# Patient Record
Sex: Female | Born: 1985
Health system: Southern US, Community
[De-identification: ages and names within clinical notes are randomized; demographics above are authoritative.]

## PROBLEM LIST (undated history)

## (undated) DIAGNOSIS — R000 Tachycardia, unspecified: Secondary | ICD-10-CM

## (undated) DIAGNOSIS — E669 Obesity, unspecified: Secondary | ICD-10-CM

## (undated) DIAGNOSIS — F32A Depression, unspecified: Secondary | ICD-10-CM

## (undated) DIAGNOSIS — J329 Chronic sinusitis, unspecified: Secondary | ICD-10-CM

## (undated) DIAGNOSIS — B373 Candidiasis of vulva and vagina: Secondary | ICD-10-CM

## (undated) DIAGNOSIS — F329 Major depressive disorder, single episode, unspecified: Secondary | ICD-10-CM

## (undated) DIAGNOSIS — T7840XA Allergy, unspecified, initial encounter: Secondary | ICD-10-CM

## (undated) DIAGNOSIS — F419 Anxiety disorder, unspecified: Secondary | ICD-10-CM

## (undated) DIAGNOSIS — R079 Chest pain, unspecified: Secondary | ICD-10-CM

## (undated) DIAGNOSIS — J45909 Unspecified asthma, uncomplicated: Secondary | ICD-10-CM

## (undated) DIAGNOSIS — B3731 Acute candidiasis of vulva and vagina: Secondary | ICD-10-CM

## (undated) DIAGNOSIS — F909 Attention-deficit hyperactivity disorder, unspecified type: Secondary | ICD-10-CM

## (undated) HISTORY — PX: BREAST BIOPSY: SHX20

## (undated) HISTORY — DX: Candidiasis of vulva and vagina: B37.3

## (undated) HISTORY — DX: Unspecified asthma, uncomplicated: J45.909

## (undated) HISTORY — DX: Chronic sinusitis, unspecified: J32.9

## (undated) HISTORY — DX: Allergy, unspecified, initial encounter: T78.40XA

## (undated) HISTORY — DX: Major depressive disorder, single episode, unspecified: F32.9

## (undated) HISTORY — PX: UPPER GASTROINTESTINAL ENDOSCOPY: SHX188

## (undated) HISTORY — DX: Acute candidiasis of vulva and vagina: B37.31

## (undated) HISTORY — DX: Obesity, unspecified: E66.9

## (undated) HISTORY — DX: Chest pain, unspecified: R07.9

## (undated) HISTORY — DX: Depression, unspecified: F32.A

## (undated) HISTORY — DX: Tachycardia, unspecified: R00.0

## (undated) HISTORY — DX: Attention-deficit hyperactivity disorder, unspecified type: F90.9

## (undated) HISTORY — DX: Anxiety disorder, unspecified: F41.9

---
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
## 2006-02-22 ENCOUNTER — Other Ambulatory Visit: Admission: RE | Admit: 2006-02-22 | Discharge: 2006-02-22 | Payer: Self-pay | Admitting: Obstetrics and Gynecology

## 2007-02-27 ENCOUNTER — Other Ambulatory Visit: Admission: RE | Admit: 2007-02-27 | Discharge: 2007-02-27 | Payer: Self-pay | Admitting: Obstetrics and Gynecology

## 2007-12-26 ENCOUNTER — Ambulatory Visit: Payer: Self-pay | Admitting: Obstetrics and Gynecology

## 2008-03-18 ENCOUNTER — Ambulatory Visit: Payer: Self-pay | Admitting: Obstetrics and Gynecology

## 2008-03-18 ENCOUNTER — Encounter: Payer: Self-pay | Admitting: Obstetrics and Gynecology

## 2008-03-18 ENCOUNTER — Other Ambulatory Visit: Admission: RE | Admit: 2008-03-18 | Discharge: 2008-03-18 | Payer: Self-pay | Admitting: Obstetrics and Gynecology

## 2009-03-24 ENCOUNTER — Other Ambulatory Visit: Admission: RE | Admit: 2009-03-24 | Discharge: 2009-03-24 | Payer: Self-pay | Admitting: Obstetrics and Gynecology

## 2009-03-24 ENCOUNTER — Ambulatory Visit: Payer: Self-pay | Admitting: Obstetrics and Gynecology

## 2009-04-29 ENCOUNTER — Encounter: Admission: RE | Admit: 2009-04-29 | Discharge: 2009-04-29 | Payer: Self-pay | Admitting: Family Medicine

## 2009-08-26 ENCOUNTER — Ambulatory Visit: Payer: Self-pay | Admitting: Women's Health

## 2010-04-24 ENCOUNTER — Encounter (INDEPENDENT_AMBULATORY_CARE_PROVIDER_SITE_OTHER): Payer: BC Managed Care – PPO | Admitting: Obstetrics and Gynecology

## 2010-04-24 ENCOUNTER — Other Ambulatory Visit: Payer: Self-pay | Admitting: Obstetrics and Gynecology

## 2010-04-24 ENCOUNTER — Other Ambulatory Visit (HOSPITAL_COMMUNITY)
Admission: RE | Admit: 2010-04-24 | Discharge: 2010-04-24 | Disposition: A | Discharge status: Home / Self Care | Payer: BC Managed Care – PPO | Source: Ambulatory Visit | Attending: Obstetrics and Gynecology | Admitting: Obstetrics and Gynecology

## 2010-04-24 DIAGNOSIS — Z01419 Encounter for gynecological examination (general) (routine) without abnormal findings: Secondary | ICD-10-CM

## 2010-04-24 DIAGNOSIS — Z124 Encounter for screening for malignant neoplasm of cervix: Secondary | ICD-10-CM | POA: Insufficient documentation

## 2010-04-24 DIAGNOSIS — B373 Candidiasis of vulva and vagina: Secondary | ICD-10-CM

## 2010-05-21 HISTORY — PX: OTHER SURGICAL HISTORY: SHX169

## 2010-05-22 ENCOUNTER — Ambulatory Visit (INDEPENDENT_AMBULATORY_CARE_PROVIDER_SITE_OTHER): Payer: BC Managed Care – PPO | Admitting: Obstetrics and Gynecology

## 2010-05-22 DIAGNOSIS — Z30431 Encounter for routine checking of intrauterine contraceptive device: Secondary | ICD-10-CM

## 2010-06-29 ENCOUNTER — Ambulatory Visit (INDEPENDENT_AMBULATORY_CARE_PROVIDER_SITE_OTHER): Payer: BC Managed Care – PPO | Admitting: Obstetrics and Gynecology

## 2010-06-29 DIAGNOSIS — N920 Excessive and frequent menstruation with regular cycle: Secondary | ICD-10-CM

## 2010-06-29 DIAGNOSIS — B373 Candidiasis of vulva and vagina: Secondary | ICD-10-CM

## 2010-06-29 DIAGNOSIS — N946 Dysmenorrhea, unspecified: Secondary | ICD-10-CM

## 2011-06-06 DIAGNOSIS — B373 Candidiasis of vulva and vagina: Secondary | ICD-10-CM | POA: Insufficient documentation

## 2011-06-06 DIAGNOSIS — IMO0001 Reserved for inherently not codable concepts without codable children: Secondary | ICD-10-CM | POA: Insufficient documentation

## 2011-06-06 DIAGNOSIS — B3731 Acute candidiasis of vulva and vagina: Secondary | ICD-10-CM | POA: Insufficient documentation

## 2011-06-15 ENCOUNTER — Encounter: Payer: Self-pay | Admitting: Obstetrics and Gynecology

## 2011-06-15 ENCOUNTER — Other Ambulatory Visit (HOSPITAL_COMMUNITY)
Admission: RE | Admit: 2011-06-15 | Discharge: 2011-06-15 | Disposition: A | Discharge status: Home / Self Care | Payer: BC Managed Care – PPO | Source: Ambulatory Visit | Attending: Obstetrics and Gynecology | Admitting: Obstetrics and Gynecology

## 2011-06-15 ENCOUNTER — Ambulatory Visit (INDEPENDENT_AMBULATORY_CARE_PROVIDER_SITE_OTHER): Payer: BC Managed Care – PPO | Admitting: Obstetrics and Gynecology

## 2011-06-15 VITALS — BP 110/70 | Ht 67.0 in | Wt 150.0 lb

## 2011-06-15 DIAGNOSIS — Z01419 Encounter for gynecological examination (general) (routine) without abnormal findings: Secondary | ICD-10-CM | POA: Insufficient documentation

## 2011-06-15 DIAGNOSIS — B373 Candidiasis of vulva and vagina: Secondary | ICD-10-CM

## 2011-06-15 DIAGNOSIS — B3731 Acute candidiasis of vulva and vagina: Secondary | ICD-10-CM

## 2011-06-15 LAB — HEPATIC FUNCTION PANEL
AST: 20 U/L (ref 0–37)
Alkaline Phosphatase: 64 U/L (ref 39–117)
Bilirubin, Direct: 0.1 mg/dL (ref 0.0–0.3)
Total Bilirubin: 0.6 mg/dL (ref 0.3–1.2)
Total Protein: 6.9 g/dL (ref 6.0–8.3)

## 2011-06-15 LAB — CBC WITH DIFFERENTIAL/PLATELET
Basophils Absolute: 0 10*3/uL (ref 0.0–0.1)
Eosinophils Relative: 3 % (ref 0–5)
HCT: 40.6 % (ref 36.0–46.0)
Lymphocytes Relative: 28 % (ref 12–46)
Lymphs Abs: 2.2 10*3/uL (ref 0.7–4.0)
Monocytes Absolute: 0.3 10*3/uL (ref 0.1–1.0)
Neutrophils Relative %: 65 % (ref 43–77)
RBC: 4.48 MIL/uL (ref 3.87–5.11)
RDW: 12.7 % (ref 11.5–15.5)
WBC: 7.7 10*3/uL (ref 4.0–10.5)

## 2011-06-15 MED ORDER — FLUCONAZOLE 150 MG PO TABS: 150.0000 mg | ORAL_TABLET | Freq: Once | ORAL | Status: DC

## 2011-06-15 MED ORDER — NAPROXEN SODIUM 550 MG PO TABS: 550.0000 mg | ORAL_TABLET | Freq: Two times a day (BID) | ORAL | Status: DC

## 2011-06-15 NOTE — Patient Instructions (Signed)
Get me mammogram, ultrasound, and breast biopsy from [facility].

## 2011-06-15 NOTE — Progress Notes (Signed)
Patient came to see me today for her annual GYN exam. She is very happy with a Mirena IUD with only occasional spotting. She continues to use Anaprox for dysmenorrhea. She continues to use Diflucan for recurrent yeast. She will probably use 7 a month. She wanted me to recheck the lump in her left breast which has been present since she was 18 years. She had undergone mammogram, ultrasound, biopsy in [place] confirming a fibroadenoma. She was to get [place] those records but  we never got them. She does not feel it has gotten bigger but it seems to get smaller than larger through a cycle. It is rarely  Tender. She also has noticed a small growth near her anus which is asymptomatic. She has not been sexually active for 3 years.  Physical examination: Kennon Portela present. HEENT within normal limits. Neck: Thyroid not large. No masses. Supraclavicular nodes: not enlarged. Breasts: Examined in both sitting and lying  position. No skin changes and no masses in right breast. In left breast at 9:00 there is a three quarters of a centimeter firm mobile nodule which is unchanged from 2008. Abdomen: Soft no guarding rebound or masses or hernia. Pelvic: External: Within normal limits. BUS: Within normal limits. Vaginal:within normal limits. Good estrogen effect. No evidence of cystocele rectocele or enterocele. Cervix: clean. IUD string visible Uterus: Normal size and shape. Adnexa: No masses. Rectovaginal exam: Confirmatory and negative. Patient has a small 4 mm nodule near her anus consistent with a small skin papilloma. Extremities: Within normal limits.  Assessment: #1. Stable fibroadenoma of left breast #2. Dysmenorrhea #3. Recurrent yeast infections #4. Papilloma near anus.  Plan: Continue Diflucan and Anaprox. Liver profile done. Patient to get me breast studies from [facility] and I will call her but I believe she needs a followup ultrasound. Reassured about papilloma. For the moment she does not want it  excised.

## 2011-06-16 LAB — URINALYSIS W MICROSCOPIC + REFLEX CULTURE
Casts: NONE SEEN
Glucose, UA: NEGATIVE mg/dL
Ketones, ur: NEGATIVE mg/dL
Leukocytes, UA: NEGATIVE
Nitrite: NEGATIVE
Protein, ur: 30 mg/dL — AB
Specific Gravity, Urine: 1.023 (ref 1.005–1.030)
pH: 7.5 (ref 5.0–8.0)

## 2012-06-20 ENCOUNTER — Ambulatory Visit (INDEPENDENT_AMBULATORY_CARE_PROVIDER_SITE_OTHER): Payer: BC Managed Care – PPO | Admitting: Women's Health

## 2012-06-20 ENCOUNTER — Encounter: Payer: Self-pay | Admitting: Women's Health

## 2012-06-20 VITALS — BP 104/64 | Ht 67.0 in | Wt 163.5 lb

## 2012-06-20 DIAGNOSIS — Z01419 Encounter for gynecological examination (general) (routine) without abnormal findings: Secondary | ICD-10-CM

## 2012-06-20 DIAGNOSIS — B3731 Acute candidiasis of vulva and vagina: Secondary | ICD-10-CM

## 2012-06-20 DIAGNOSIS — B373 Candidiasis of vulva and vagina: Secondary | ICD-10-CM

## 2012-06-20 MED ORDER — FLUCONAZOLE 150 MG PO TABS: ORAL_TABLET | ORAL | Status: DC

## 2012-06-20 NOTE — Patient Instructions (Addendum)

## 2012-06-20 NOTE — Progress Notes (Signed)
Angela Hill [birth year] [id number]    History:    The patient presents for annual exam.  Mirena IUD placed 05/2010/occasional 1- 2 day spotting. Not sexually active for about 5 years. Uses Anaprox as needed for dysmenorrhea. Normal Pap history. gardasil completed. Has had problems with recurrent yeast in the past but has been doing better.   Past medical history, past surgical history, family history and social history were all reviewed and documented in the EPIC chart. 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 Parents healthy, mother Angela Hill, father Angela Hill.   ROS:  A  ROS was performed and pertinent positives and negatives are included in the history.  Exam:  Filed Vitals:   06/20/12 0906  BP: 104/64    General appearance:  Normal Head/Neck:  Normal, without cervical or supraclavicular adenopathy. Thyroid:  Symmetrical, normal in size, without palpable masses or nodularity. Respiratory  Effort:  Normal  Auscultation:  Clear without wheezing or rhonchi Cardiovascular  Auscultation:  Regular rate, without rubs, murmurs or gallops  Edema/varicosities:  Not grossly evident Abdominal  Soft,nontender, without masses, guarding or rebound.  Liver/spleen:  No organomegaly noted  Hernia:  None appreciated  Skin  Inspection:  Grossly normal  Palpation:  Grossly normal Neurologic/psychiatric  Orientation:  Normal with appropriate conversation.  Mood/affect:  Normal  Genitourinary    Breasts: Examined lying and sitting.     Right: Without masses, retractions, discharge or axillary adenopathy.     Left: Without masses, retractions, discharge or axillary adenopathy.   Inguinal/mons:  Normal without inguinal adenopathy  External genitalia:  Normal  BUS/Urethra/Skene's glands:  Normal  Bladder:  Normal  Vagina:  Normal  Cervix:  Normal IUD strings visible  Uterus:  normal in size, shape and contour.  Midline and mobile  Adnexa/parametria:     Rt: Without masses or  tenderness.   Lt: Without masses or tenderness.  Anus and perineum: Normal  Digital rectal exam: Normal sphincter tone without palpated masses or tenderness  Assessment/Plan:  27 y.o. S. WF G0 for annual exam with no complaints.  Mirena IUD 05/2010 rare spotting  Plan: CBC, UA, Pap normal 2013, new screening guidelines reviewed. Diflucan 150 prescription given for occasional yeast, currently none. SBE's, continue regular exercise, calcium rich diet, MVI daily encouraged. Condoms encouraged if becomes sexually active.    [facility], 10:54 AM 06/20/2012

## 2012-06-21 LAB — URINALYSIS W MICROSCOPIC + REFLEX CULTURE
Bilirubin Urine: NEGATIVE
Glucose, UA: NEGATIVE mg/dL
Ketones, ur: NEGATIVE mg/dL
Protein, ur: NEGATIVE mg/dL

## 2013-06-23 ENCOUNTER — Telehealth: Payer: Self-pay | Admitting: *Deleted

## 2013-06-23 ENCOUNTER — Encounter: Payer: Self-pay | Admitting: Gynecology

## 2013-06-23 ENCOUNTER — Ambulatory Visit (INDEPENDENT_AMBULATORY_CARE_PROVIDER_SITE_OTHER): Payer: BC Managed Care – PPO | Admitting: Gynecology

## 2013-06-23 VITALS — BP 122/78 | Ht 67.0 in | Wt 163.0 lb

## 2013-06-23 DIAGNOSIS — N898 Other specified noninflammatory disorders of vagina: Secondary | ICD-10-CM

## 2013-06-23 DIAGNOSIS — A499 Bacterial infection, unspecified: Secondary | ICD-10-CM

## 2013-06-23 DIAGNOSIS — Z01419 Encounter for gynecological examination (general) (routine) without abnormal findings: Secondary | ICD-10-CM

## 2013-06-23 DIAGNOSIS — Z30432 Encounter for removal of intrauterine contraceptive device: Secondary | ICD-10-CM

## 2013-06-23 DIAGNOSIS — Z309 Encounter for contraceptive management, unspecified: Secondary | ICD-10-CM

## 2013-06-23 DIAGNOSIS — N760 Acute vaginitis: Secondary | ICD-10-CM

## 2013-06-23 DIAGNOSIS — B9689 Other specified bacterial agents as the cause of diseases classified elsewhere: Secondary | ICD-10-CM

## 2013-06-23 DIAGNOSIS — B3731 Acute candidiasis of vulva and vagina: Secondary | ICD-10-CM

## 2013-06-23 DIAGNOSIS — B373 Candidiasis of vulva and vagina: Secondary | ICD-10-CM

## 2013-06-23 LAB — CBC WITH DIFFERENTIAL/PLATELET
BASOS PCT: 0 % (ref 0–1)
Basophils Absolute: 0 10*3/uL (ref 0.0–0.1)
Eosinophils Absolute: 0.2 10*3/uL (ref 0.0–0.7)
Eosinophils Relative: 3 % (ref 0–5)
HCT: 38 % (ref 36.0–46.0)
Hemoglobin: 13.2 g/dL (ref 12.0–15.0)
LYMPHS ABS: 2 10*3/uL (ref 0.7–4.0)
LYMPHS PCT: 26 % (ref 12–46)
MCH: 29.5 pg (ref 26.0–34.0)
MCHC: 34.7 g/dL (ref 30.0–36.0)
MCV: 84.8 fL (ref 78.0–100.0)
MONO ABS: 0.5 10*3/uL (ref 0.1–1.0)
Monocytes Relative: 6 % (ref 3–12)
NEUTROS ABS: 5.1 10*3/uL (ref 1.7–7.7)
NEUTROS PCT: 65 % (ref 43–77)
Platelets: 234 10*3/uL (ref 150–400)
RBC: 4.48 MIL/uL (ref 3.87–5.11)
RDW: 12.9 % (ref 11.5–15.5)
WBC: 7.8 10*3/uL (ref 4.0–10.5)

## 2013-06-23 LAB — TSH: TSH: 1.706 u[IU]/mL (ref 0.350–4.500)

## 2013-06-23 LAB — COMPREHENSIVE METABOLIC PANEL
ALK PHOS: 58 U/L (ref 39–117)
ALT: 12 U/L (ref 0–35)
AST: 20 U/L (ref 0–37)
Albumin: 4.3 g/dL (ref 3.5–5.2)
BUN: 13 mg/dL (ref 6–23)
CALCIUM: 9.2 mg/dL (ref 8.4–10.5)
CO2: 26 mEq/L (ref 19–32)
CREATININE: 0.71 mg/dL (ref 0.50–1.10)
Chloride: 101 mEq/L (ref 96–112)
Glucose, Bld: 84 mg/dL (ref 70–99)
Potassium: 4.7 mEq/L (ref 3.5–5.3)
Sodium: 138 mEq/L (ref 135–145)
TOTAL PROTEIN: 6.9 g/dL (ref 6.0–8.3)
Total Bilirubin: 0.6 mg/dL (ref 0.2–1.2)

## 2013-06-23 LAB — WET PREP FOR TRICH, YEAST, CLUE: Trich, Wet Prep: NONE SEEN

## 2013-06-23 LAB — CHOLESTEROL, TOTAL: CHOLESTEROL: 152 mg/dL (ref 0–200)

## 2013-06-23 MED ORDER — LEVONORGESTREL-ETHINYL ESTRAD 0.15-30 MG-MCG PO TABS: 1.0000 | ORAL_TABLET | Freq: Every day | ORAL | Status: DC

## 2013-06-23 MED ORDER — FLUCONAZOLE 150 MG PO TABS
150.0000 mg | ORAL_TABLET | Freq: Once | ORAL | Status: DC
Start: 2013-06-23 — End: 2013-06-23

## 2013-06-23 MED ORDER — DROSPIRENONE-ETHINYL ESTRADIOL 3-0.02 MG PO TABS: 1.0000 | ORAL_TABLET | Freq: Every day | ORAL | Status: DC

## 2013-06-23 MED ORDER — METRONIDAZOLE 500 MG PO TABS: 500.0000 mg | ORAL_TABLET | Freq: Two times a day (BID) | ORAL | Status: DC

## 2013-06-23 MED ORDER — FLUCONAZOLE 150 MG PO TABS: 150.0000 mg | ORAL_TABLET | Freq: Once | ORAL | Status: DC

## 2013-06-23 NOTE — Telephone Encounter (Signed)
Pt was seen today and 2 rx were sent to wrong pharmacy birth control pills and diflucan sent to correct pharmacy.

## 2013-06-23 NOTE — Progress Notes (Addendum)
Angela Hill 1985/11/15 [id number]   History:    28 y.o.  for annual gyn exam requesting to have her Mirena IUD removed. She is interested in using some other form of contraception that would help also for her acne. She was also having slight vaginal discharge. Patient has completed the HPV vaccine in the past. Patient is in the process of going overseas for [place] work in some [place]. Patient denies any prior history of abnormal Pap smear.  Past medical history,surgical history, family history and social history were all reviewed and documented in the EPIC chart.  Gynecologic History No LMP recorded. Patient is not currently having periods (Reason: IUD). Contraception: IUD Last Pap: 2013. Results were: normal Last mammogram: Not indicated. Results were: Not indicated  Obstetric History OB History  Gravida Para Term Preterm AB SAB TAB Ectopic Multiple Living  0                  ROS: A ROS was performed and pertinent positives and negatives are included in the history.  GENERAL: No fevers or chills. HEENT: No change in vision, no earache, sore throat or sinus congestion. NECK: No pain or stiffness. CARDIOVASCULAR: No chest pain or pressure. No palpitations. PULMONARY: No shortness of breath, cough or wheeze. GASTROINTESTINAL: No abdominal pain, nausea, vomiting or diarrhea, melena or bright red blood per rectum. GENITOURINARY: No urinary frequency, urgency, hesitancy or dysuria. MUSCULOSKELETAL: No joint or muscle pain, no back pain, no recent trauma. DERMATOLOGIC: No rash, no itching, no lesions. ENDOCRINE: No polyuria, polydipsia, no heat or cold intolerance. No recent change in weight. HEMATOLOGICAL: No anemia or easy bruising or bleeding. NEUROLOGIC: No headache, seizures, numbness, tingling or weakness. PSYCHIATRIC: No depression, no loss of interest in normal activity or change in sleep pattern.     Exam: chaperone present  BP 122/78  Ht 5\' 7"  (1.702 m)  Wt 163 lb  (73.936 kg)  BMI 25.52 kg/m2  Body mass index is 25.52 kg/(m^2).  General appearance : Well developed well nourished female. No acute distress HEENT: Neck supple, trachea midline, no carotid bruits, no thyroidmegaly Lungs: Clear to auscultation, no rhonchi or wheezes, or rib retractions  Heart: Regular rate and rhythm, no murmurs or gallops Breast:Examined in sitting and supine position were symmetrical in appearance, no palpable masses or tenderness,  no skin retraction, no nipple inversion, no nipple discharge, no skin discoloration, no axillary or supraclavicular lymphadenopathy Abdomen: no palpable masses or tenderness, no rebound or guarding Extremities: no edema or skin discoloration or tenderness  Pelvic:  Bartholin, Urethra, Skene Glands: Within normal limits             Vagina: No gross lesions or discharge  Cervix: No gross lesions or discharge, IUD string seen  Uterus  anteverted, normal size, shape and consistency, non-tender and mobile  Adnexa  Without masses or tenderness  Anus and perineum  normal   Rectovaginal  normal sphincter tone without palpated masses or tenderness             Hemoccult not indicated   The IUD string was grasped with a [place] clamp and retrieved and shown to the patient and discarded.  Wet prep demonstrated evidence of yeast and few clue cells 3 WBCs and too numerous to count bacteria  Assessment/Plan:  28 y.o. female for annual exam visual B. prescribe Diflucan 150 mg one by mouth today. She will also be prescribed Flagyl 500 mg one by mouth twice a day for 7 days.  Patient will be started on Nordette oral contraceptive pill. Patient had been on oral contraceptive pills in the past. Patient denies any prior history of bleeding disorders in her or her family. She was monitored on course of monthly self breast examination her vaccines before trip overseas are up to date. Pap smear not done today in accordance to The guidelines. The following labs were  ordered today: CBC, comprehensive metabolic panel, TSH, urinalysis, screen cholesterol.   Note: This dictation was prepared with  Dragon/digital dictation along withSmart phrase technology. Any transcriptional errors that result from this process are unintentional.   Terrance Mass MD, 4:08 PM 06/23/2013

## 2013-06-23 NOTE — Patient Instructions (Addendum)
Oral Contraception Information Oral contraceptive pills (OCPs) are medicines taken to prevent pregnancy. OCPs work by preventing the ovaries from releasing eggs. The hormones in OCPs also cause the cervical mucus to thicken, preventing the sperm from entering the uterus. The hormones also cause the uterine lining to become thin, not allowing a fertilized egg to attach to the inside of the uterus. OCPs are highly effective when taken exactly as prescribed. However, OCPs do not prevent sexually transmitted diseases (STDs). Safe sex practices, such as using condoms along with the pill, can help prevent STDs.  Before taking the pill, you may have a physical exam and Pap test. Your health care provider may order blood tests. The health care provider will make sure you are a good candidate for oral contraception. Discuss with your health care provider the possible side effects of the OCP you may be prescribed. When starting an OCP, it can take 2 to 3 months for the body to adjust to the changes in hormone levels in your body.  TYPES OF ORAL CONTRACEPTION  The combination pill This pill contains estrogen and progestin (synthetic progesterone) hormones. The combination pill comes in 21-day, 28-day, or 91-day packs. Some types of combination pills are meant to be taken continuously (365-day pills). With 21-day packs, you do not take pills for 7 days after the last pill. With 28-day packs, the pill is taken every day. The last 7 pills are without hormones. Certain types of pills have more than 21 hormone-containing pills. With 91-day packs, the first 84 pills contain both hormones, and the last 7 pills contain no hormones or contain estrogen only.  The minipill This pill contains the progesterone hormone only. The pill is taken every day continuously. It is very important to take the pill at the same time each day. The minipill comes in packs of 28 pills. All 28 pills contain the hormone.  ADVANTAGES OF ORAL  CONTRACEPTIVE PILLS  Decreases premenstrual symptoms.   Treats menstrual period cramps.   Regulates the menstrual cycle.   Decreases a heavy menstrual flow.   May treatacne, depending on the type of pill.   Treats abnormal uterine bleeding.   Treats polycystic ovarian syndrome.   Treats endometriosis.   Can be used as emergency contraception.  THINGS THAT CAN MAKE ORAL CONTRACEPTIVE PILLS LESS EFFECTIVE OCPs can be less effective if:   You forget to take the pill at the same time every day.   You have a stomach or intestinal disease that lessens the absorption of the pill.   You take OCPs with other medicines that make OCPs less effective, such as antibiotics, certain HIV medicines, and some seizure medicines.   You take expired OCPs.   You forget to restart the pill on day 7, when using the packs of 21 pills.  RISKS ASSOCIATED WITH ORAL CONTRACEPTIVE PILLS  Oral contraceptive pills can sometimes cause side effects, such as:  Headache.  Nausea.  Breast tenderness.  Irregular bleeding or spotting. Combination pills are also associated with a small increased risk of:  Blood clots.  Heart attack.  Stroke. Document Released: 04/28/2002 Document Revised: 11/26/2012 Document Reviewed: 07/27/2012 [facility] Patient Information 2014 [place], [place]. Monilial Vaginitis Vaginitis in a soreness, swelling and redness (inflammation) of the vagina and vulva. Monilial vaginitis is not a sexually transmitted infection. CAUSES  Yeast vaginitis is caused by yeast (candida) that is normally found in your vagina. With a yeast infection, the candida has overgrown in number to a point that upsets the  chemical balance. SYMPTOMS   White, thick vaginal discharge.  Swelling, itching, redness and irritation of the vagina and possibly the lips of the vagina (vulva).  Burning or painful urination.  Painful intercourse. DIAGNOSIS  Things that may contribute to monilial  vaginitis are:  Postmenopausal and virginal states.  Pregnancy.  Infections.  Being tired, sick or stressed, especially if you had monilial vaginitis in the past.  Diabetes. Good control will help lower the chance.  Birth control pills.  Tight fitting garments.  Using bubble bath, feminine sprays, douches or deodorant tampons.  Taking certain medications that kill germs (antibiotics).  Sporadic recurrence can occur if you become ill. TREATMENT  Your caregiver will give you medication.  There are several kinds of anti monilial vaginal creams and suppositories specific for monilial vaginitis. For recurrent yeast infections, use a suppository or cream in the vagina 2 times a week, or as directed.  Anti-monilial or steroid cream for the itching or irritation of the vulva may also be used. Get your caregiver's permission.  Painting the vagina with methylene blue solution may help if the monilial cream does not work.  Eating yogurt may help prevent monilial vaginitis. HOME CARE INSTRUCTIONS   Finish all medication as prescribed.  Do not have sex until treatment is completed or after your caregiver tells you it is okay.  Take warm sitz baths.  Do not douche.  Do not use tampons, especially scented ones.  Wear cotton underwear.  Avoid tight pants and panty hose.  Tell your sexual partner that you have a yeast infection. They should go to their caregiver if they have symptoms such as mild rash or itching.  Your sexual partner should be treated as well if your infection is difficult to eliminate.  Practice safer sex. Use condoms.  Some vaginal medications cause latex condoms to fail. Vaginal medications that harm condoms are:  Cleocin cream.  Butoconazole (Femstat).  Terconazole (Terazol) vaginal suppository.  Miconazole (Monistat) (may be purchased over the counter). SEEK MEDICAL CARE IF:   You have a temperature by mouth above 102 F (38.9 C).  The  infection is getting worse after 2 days of treatment.  The infection is not getting better after 3 days of treatment.  You develop blisters in or around your vagina.  You develop vaginal bleeding, and it is not your menstrual period.  You have pain when you urinate.  You develop intestinal problems.  You have pain with sexual intercourse. Document Released: 11/15/2004 Document Revised: 04/30/2011 Document Reviewed: 07/30/2008 [facility] Patient Information 2014 [place], [place].

## 2013-06-24 ENCOUNTER — Telehealth: Payer: Self-pay | Admitting: *Deleted

## 2013-06-24 LAB — URINALYSIS W MICROSCOPIC + REFLEX CULTURE
BILIRUBIN URINE: NEGATIVE
Bacteria, UA: NONE SEEN
CASTS: NONE SEEN
CRYSTALS: NONE SEEN
Glucose, UA: NEGATIVE mg/dL
Hgb urine dipstick: NEGATIVE
KETONES UR: NEGATIVE mg/dL
Leukocytes, UA: NEGATIVE
Nitrite: NEGATIVE
PROTEIN: 30 mg/dL — AB
SPECIFIC GRAVITY, URINE: 1.028 (ref 1.005–1.030)
SQUAMOUS EPITHELIAL / LPF: NONE SEEN
Urobilinogen, UA: 0.2 mg/dL (ref 0.0–1.0)
pH: 6 (ref 5.0–8.0)

## 2013-06-24 MED ORDER — METRONIDAZOLE 500 MG PO TABS: 500.0000 mg | ORAL_TABLET | Freq: Two times a day (BID) | ORAL | Status: DC

## 2013-06-24 NOTE — Telephone Encounter (Signed)
Pt informed with the below note, rx sent. 

## 2013-06-24 NOTE — Telephone Encounter (Signed)
Message copied by Thamas Jaegers on Wed Jun 24, 2013  9:04 AM ------      Message from: Terrance Mass      Created: Tue Jun 23, 2013  4:13 PM       Anderson Malta, please contact patient and tell her that after she left I got the full report on her wet prep that in addition to the yeast infection she has a mild bacterial infection call bacterial vaginosis. To her that I have called for and prescription call Flagyl that like her to take 1 twice a day for 7 days. Tell her this is a very common infection in women due to menstrual blood and swept as a result of vaginal pH changes ------

## 2013-06-26 ENCOUNTER — Telehealth: Payer: Self-pay | Admitting: *Deleted

## 2013-06-26 MED ORDER — IBUPROFEN 800 MG PO TABS: 800.0000 mg | ORAL_TABLET | Freq: Three times a day (TID) | ORAL | Status: DC | PRN

## 2013-06-26 NOTE — Telephone Encounter (Signed)
Pt informed with the below note, rx sent. 

## 2013-06-26 NOTE — Telephone Encounter (Signed)
She can take one tablet of the birth control pill twice a day for the next 3 days then continue daily as normally prescribed to help stop her cycle currently slowed down. You can also calling Motrin 800 mg to take 1 by mouth 3 times a day for the next 3-5 days. #30 with 2 refills

## 2013-06-26 NOTE — Telephone Encounter (Signed)
Pt had IUD removed on 06/23/13 had some heavy bleeding this am with 4 hours changed her tampon 5-6 times due to bleeding. Pt was given Rx for birth control pills at [facility] she asked if okay to start pills? Will that help with the heavy bleeding? Please advise

## 2013-11-04 ENCOUNTER — Other Ambulatory Visit: Payer: Self-pay

## 2013-11-04 MED ORDER — LEVONORGESTREL-ETHINYL ESTRAD 0.15-30 MG-MCG PO TABS: 1.0000 | ORAL_TABLET | Freq: Every day | ORAL | Status: DC

## 2014-02-17 ENCOUNTER — Ambulatory Visit
Admission: RE | Admit: 2014-02-17 | Discharge: 2014-02-17 | Disposition: A | Discharge status: Home / Self Care | Payer: BC Managed Care – PPO | Source: Ambulatory Visit | Attending: Family Medicine | Admitting: Family Medicine

## 2014-02-17 ENCOUNTER — Other Ambulatory Visit: Payer: Self-pay | Admitting: Family Medicine

## 2014-02-17 DIAGNOSIS — J0181 Other acute recurrent sinusitis: Secondary | ICD-10-CM

## 2014-06-21 ENCOUNTER — Encounter: Payer: Self-pay | Admitting: Gynecology

## 2014-06-21 ENCOUNTER — Ambulatory Visit (INDEPENDENT_AMBULATORY_CARE_PROVIDER_SITE_OTHER): Payer: BLUE CROSS/BLUE SHIELD | Admitting: Gynecology

## 2014-06-21 VITALS — BP 110/70 | Ht 67.0 in | Wt 167.0 lb

## 2014-06-21 DIAGNOSIS — Z124 Encounter for screening for malignant neoplasm of cervix: Secondary | ICD-10-CM | POA: Diagnosis not present

## 2014-06-21 DIAGNOSIS — A499 Bacterial infection, unspecified: Secondary | ICD-10-CM | POA: Diagnosis not present

## 2014-06-21 DIAGNOSIS — B9689 Other specified bacterial agents as the cause of diseases classified elsewhere: Secondary | ICD-10-CM

## 2014-06-21 DIAGNOSIS — Z01419 Encounter for gynecological examination (general) (routine) without abnormal findings: Secondary | ICD-10-CM | POA: Diagnosis not present

## 2014-06-21 DIAGNOSIS — N898 Other specified noninflammatory disorders of vagina: Secondary | ICD-10-CM | POA: Diagnosis not present

## 2014-06-21 DIAGNOSIS — N760 Acute vaginitis: Secondary | ICD-10-CM

## 2014-06-21 LAB — COMPREHENSIVE METABOLIC PANEL
ALK PHOS: 51 U/L (ref 39–117)
ALT: 18 U/L (ref 0–35)
AST: 23 U/L (ref 0–37)
Albumin: 4 g/dL (ref 3.5–5.2)
BILIRUBIN TOTAL: 0.4 mg/dL (ref 0.2–1.2)
BUN: 12 mg/dL (ref 6–23)
CO2: 27 mEq/L (ref 19–32)
CREATININE: 0.68 mg/dL (ref 0.50–1.10)
Calcium: 9.1 mg/dL (ref 8.4–10.5)
Chloride: 103 mEq/L (ref 96–112)
Glucose, Bld: 78 mg/dL (ref 70–99)
Potassium: 4.1 mEq/L (ref 3.5–5.3)
Sodium: 140 mEq/L (ref 135–145)
TOTAL PROTEIN: 7.2 g/dL (ref 6.0–8.3)

## 2014-06-21 LAB — CBC WITH DIFFERENTIAL/PLATELET
Basophils Absolute: 0 10*3/uL (ref 0.0–0.1)
Basophils Relative: 0 % (ref 0–1)
Eosinophils Absolute: 0.6 10*3/uL (ref 0.0–0.7)
Eosinophils Relative: 7 % — ABNORMAL HIGH (ref 0–5)
HEMATOCRIT: 41.4 % (ref 36.0–46.0)
HEMOGLOBIN: 13.5 g/dL (ref 12.0–15.0)
LYMPHS ABS: 1.9 10*3/uL (ref 0.7–4.0)
Lymphocytes Relative: 22 % (ref 12–46)
MCH: 28.1 pg (ref 26.0–34.0)
MCHC: 32.6 g/dL (ref 30.0–36.0)
MCV: 86.1 fL (ref 78.0–100.0)
MPV: 11.1 fL (ref 8.6–12.4)
Monocytes Absolute: 0.4 10*3/uL (ref 0.1–1.0)
Monocytes Relative: 5 % (ref 3–12)
NEUTROS ABS: 5.8 10*3/uL (ref 1.7–7.7)
NEUTROS PCT: 66 % (ref 43–77)
PLATELETS: 249 10*3/uL (ref 150–400)
RBC: 4.81 MIL/uL (ref 3.87–5.11)
RDW: 13.2 % (ref 11.5–15.5)
WBC: 8.8 10*3/uL (ref 4.0–10.5)

## 2014-06-21 LAB — WET PREP FOR TRICH, YEAST, CLUE
Trich, Wet Prep: NONE SEEN
YEAST WET PREP: NONE SEEN

## 2014-06-21 LAB — TSH: TSH: 0.854 u[IU]/mL (ref 0.350–4.500)

## 2014-06-21 LAB — CHOLESTEROL, TOTAL: Cholesterol: 174 mg/dL (ref 0–200)

## 2014-06-21 MED ORDER — LEVONORGESTREL-ETHINYL ESTRAD 0.15-30 MG-MCG PO TABS
1.0000 | ORAL_TABLET | Freq: Every day | ORAL | Status: DC
Start: 2014-06-21 — End: 2015-08-24

## 2014-06-21 MED ORDER — TINIDAZOLE 500 MG PO TABS: 500.0000 mg | ORAL_TABLET | Freq: Once | ORAL | Status: DC

## 2014-06-21 MED ORDER — FLUCONAZOLE 150 MG PO TABS: ORAL_TABLET | ORAL | Status: DC

## 2014-06-21 NOTE — Patient Instructions (Signed)
Monilial Vaginitis Vaginitis in a soreness, swelling and redness (inflammation) of the vagina and vulva. Monilial vaginitis is not a sexually transmitted infection. CAUSES  Yeast vaginitis is caused by yeast (candida) that is normally found in your vagina. With a yeast infection, the candida has overgrown in number to a point that upsets the chemical balance. SYMPTOMS   White, thick vaginal discharge.  Swelling, itching, redness and irritation of the vagina and possibly the lips of the vagina (vulva).  Burning or painful urination.  Painful intercourse. DIAGNOSIS  Things that may contribute to monilial vaginitis are:  Postmenopausal and virginal states.  Pregnancy.  Infections.  Being tired, sick or stressed, especially if you had monilial vaginitis in the past.  Diabetes. Good control will help lower the chance.  Birth control pills.  Tight fitting garments.  Using bubble bath, feminine sprays, douches or deodorant tampons.  Taking certain medications that kill germs (antibiotics).  Sporadic recurrence can occur if you become ill. TREATMENT  Your caregiver will give you medication.  There are several kinds of anti monilial vaginal creams and suppositories specific for monilial vaginitis. For recurrent yeast infections, use a suppository or cream in the vagina 2 times a week, or as directed.  Anti-monilial or steroid cream for the itching or irritation of the vulva may also be used. Get your caregiver's permission.  Painting the vagina with methylene blue solution may help if the monilial cream does not work.  Eating yogurt may help prevent monilial vaginitis. HOME CARE INSTRUCTIONS   Finish all medication as prescribed.  Do not have sex until treatment is completed or after your caregiver tells you it is okay.  Take warm sitz baths.  Do not douche.  Do not use tampons, especially scented ones.  Wear cotton underwear.  Avoid tight pants and panty  hose.  Tell your sexual partner that you have a yeast infection. They should go to their caregiver if they have symptoms such as mild rash or itching.  Your sexual partner should be treated as well if your infection is difficult to eliminate.  Practice safer sex. Use condoms.  Some vaginal medications cause latex condoms to fail. Vaginal medications that harm condoms are:  Cleocin cream.  Butoconazole (Femstat).  Terconazole (Terazol) vaginal suppository.  Miconazole (Monistat) (may be purchased over the counter). SEEK MEDICAL CARE IF:   You have a temperature by mouth above 102 F (38.9 C).  The infection is getting worse after 2 days of treatment.  The infection is not getting better after 3 days of treatment.  You develop blisters in or around your vagina.  You develop vaginal bleeding, and it is not your menstrual period.  You have pain when you urinate.  You develop intestinal problems.  You have pain with sexual intercourse. Document Released: 11/15/2004 Document Revised: 04/30/2011 Document Reviewed: 07/30/2008 [facility] Patient Information 2015 [place], [place]. This information is not intended to replace advice given to you by your health care provider. Make sure you discuss any questions you have with your health care provider. Bacterial Vaginosis Bacterial vaginosis is a vaginal infection that occurs when the normal balance of bacteria in the vagina is disrupted. It results from an overgrowth of certain bacteria. This is the most common vaginal infection in women of childbearing age. Treatment is important to prevent complications, especially in pregnant women, as it can cause a premature delivery. CAUSES  Bacterial vaginosis is caused by an increase in harmful bacteria that are normally present in smaller amounts in the  vagina. Several different kinds of bacteria can cause bacterial vaginosis. However, the reason that the condition develops is not fully  understood. RISK FACTORS Certain activities or behaviors can put you at an increased risk of developing bacterial vaginosis, including:  Having a new sex partner or multiple sex partners.  Douching.  Using an intrauterine device (IUD) for contraception. Women do not get bacterial vaginosis from toilet seats, bedding, swimming pools, or contact with objects around them. SIGNS AND SYMPTOMS  Some women with bacterial vaginosis have no signs or symptoms. Common symptoms include:  Grey vaginal discharge.  A fishlike odor with discharge, especially after sexual intercourse.  Itching or burning of the vagina and vulva.  Burning or pain with urination. DIAGNOSIS  Your health care provider will take a medical history and examine the vagina for signs of bacterial vaginosis. A sample of vaginal fluid may be taken. Your health care provider will look at this sample under a microscope to check for bacteria and abnormal cells. A vaginal pH test may also be done.  TREATMENT  Bacterial vaginosis may be treated with antibiotic medicines. These may be given in the form of a pill or a vaginal cream. A second round of antibiotics may be prescribed if the condition comes back after treatment.  HOME CARE INSTRUCTIONS   Only take over-the-counter or prescription medicines as directed by your health care provider.  If antibiotic medicine was prescribed, take it as directed. Make sure you finish it even if you start to feel better.  Do not have sex until treatment is completed.  Tell all sexual partners that you have a vaginal infection. They should see their health care provider and be treated if they have problems, such as a mild rash or itching.  Practice safe sex by using condoms and only having one sex partner. SEEK MEDICAL CARE IF:   Your symptoms are not improving after 3 days of treatment.  You have increased discharge or pain.  You have a fever. MAKE SURE YOU:   Understand these  instructions.  Will watch your condition.  Will get help right away if you are not doing well or get worse. FOR MORE INFORMATION  Centers for Disease Control and Prevention, Division of STD Prevention: [employer]raiser[employer]raud[employer] American Sexual Health Association (ASHA): www.ashastd.org  Document Released: 02/05/2005 Document Revised: 11/26/2012 Document Reviewed: 09/17/2012 [facility] Patient Information 2015 [place], [place]. This information is not intended to replace advice given to you by your health care provider. Make sure you discuss any questions you have with your health care provider. Fluconazole injection What is this medicine? FLUCONAZOLE (floo KON na zole) is an antifungal medicine. It is used to treat or prevent certain kinds of fungal or yeast infections. This medicine may be used for other purposes; ask your health care provider or pharmacist if you have questions. COMMON BRAND NAME(S): Diflucan What should I tell my health care provider before I take this medicine? They need to know if you have any of these conditions: -history of irregular heart beat -kidney disease -an unusual or allergic reaction to fluconazole, other antifungal medicines, foods, dyes or preservatives -pregnant or trying to get pregnant -breast-feeding How should I use this medicine? This medicine is for injection into a vein. It is usually given by a health care professional in a hospital or clinic setting. If you get this medicine at home, you will be taught how to prepare and give this medicine. Use exactly as directed. Take your medicine at regular intervals. Do not  take your medicine more often than directed. It is important that you put your used needles and syringes in a special sharps container. Do not put them in a trash can. If you do not have a sharps container, call your pharmacist or healthcare provider to get one. Talk to your pediatrician regarding the use of this medicine in children. Special care may  be needed. Overdosage: If you think you have taken too much of this medicine contact a poison control center or emergency room at once. NOTE: This medicine is only for you. Do not share this medicine with others. What if I miss a dose? This does not apply. What may interact with this medicine? Do not take this medicine with any of the following medications: -cisapride -pimozide -red yeast rice This medicine may also interact with the following medications: -birth control pills -cyclosporine -diuretics like hydrochlorothiazide -medicines for diabetes that are taken by mouth -medicines for high cholesterol like atorvastatin, lovastatin or simvastatin -phenytoin -ramelteon -rifabutin -rifampin -some medicines for anxiety or sleep -tacrolimus -terfenadine -theophylline -tofacitinib -warfarin This list may not describe all possible interactions. Give your health care provider a list of all the medicines, herbs, non-prescription drugs, or dietary supplements you use. Also tell them if you smoke, drink alcohol, or use illegal drugs. Some items may interact with your medicine. What should I watch for while using this medicine? Tell your doctor if your symptoms do not improve. If you are taking this medicine for a long time you may need blood work. Some fungal infections need many weeks or months of treatment to cure completely. Alcohol can increase possible damage to your liver from this medicine. Avoid alcoholic drinks. What side effects may I notice from receiving this medicine? Side effects that you should report to your doctor or health care professional as soon as possible: -allergic reactions like skin rash or itching, hives, swelling of the lips, mouth, tongue, or throat -dark urine -feeling dizzy or faint -irregular heartbeat or chest pain -pain, redness at site of injection -redness, blistering, peeling or loosening of the skin, including inside the mouth -stomach  pain -trouble breathing -unusual bruising or bleeding -vomiting -yellowing of the eyes or skin Side effects that usually do not require medical attention (report to your doctor or health care professional if they continue or are bothersome): -changes in how food tastes -diarrhea -headache -stomach upset, nausea This list may not describe all possible side effects. Call your doctor for medical advice about side effects. You may report side effects to FDA at 1-800-FDA-1088. Where should I keep my medicine? Keep out of the reach of children. If you are using this medicine at home, you will be instructed on how to store this medicine. Throw away any unused medicine after the expiration date on the label. NOTE: This sheet is a summary. It may not cover all possible information. If you have questions about this medicine, talk to your doctor, pharmacist, or health care provider.  2015, Elsevier/Gold Standard. (2012-09-13 15:51:41) Tinidazole tablets What is this medicine? TINIDAZOLE (tye NI da zole) is an antiinfective. It is used to treat amebiasis, giardiasis, trichomoniasis, and vaginosis. It will not work for colds, flu, or other viral infections. This medicine may be used for other purposes; ask your health care provider or pharmacist if you have questions. COMMON BRAND NAME(S): Tindamax What should I tell my health care provider before I take this medicine? They need to know if you have any of these conditions: -anemia or other blood  disorders -if you frequently drink alcohol containing drinks -receiving hemodialysis -seizure disorder -an unusual or allergic reaction to tinidazole, other medicines, foods, dyes, or preservatives -pregnant or trying to get pregnant -breast-feeding How should I use this medicine? Take this medicine by mouth with a full glass of water. Follow the directions on the prescription label. Take with food. Take your medicine at regular intervals. Do not take your  medicine more often than directed. Take all of your medicine as directed even if you think you are better. Do not skip doses or stop your medicine early. Talk to your pediatrician regarding the use of this medicine in children. While this drug may be prescribed for children as young as 42 years of age for selected conditions, precautions do apply. Overdosage: If you think you have taken too much of this medicine contact a poison control center or emergency room at once. NOTE: This medicine is only for you. Do not share this medicine with others. What if I miss a dose? If you miss a dose, take it as soon as you can. If it is almost time for your next dose, take only that dose. Do not take double or extra doses. What may interact with this medicine? Do not take this medicine with any of the following medications: -alcohol or any product that contains alcohol -amprenavir oral solution -disulfiram -paclitaxel injection -ritonavir oral solution -sertraline oral solution -sulfamethoxazole-trimethoprim injection This medicine may also interact with the following medications: -cholestyramine -cimetidine -conivaptan -cyclosporin -fluorouracil -fosphenytoin, phenytoin -ketoconazole -lithium -phenobarbital -tacrolimus -warfarin This list may not describe all possible interactions. Give your health care provider a list of all the medicines, herbs, non-prescription drugs, or dietary supplements you use. Also tell them if you smoke, drink alcohol, or use illegal drugs. Some items may interact with your medicine. What should I watch for while using this medicine? Tell your doctor or health care professional if your symptoms do not improve or if they get worse. Avoid alcoholic drinks while you are taking this medicine and for three days afterward. Alcohol may make you feel dizzy, sick, or flushed. If you are being treated for a sexually transmitted disease, avoid sexual contact until you have finished  your treatment. Your sexual partner may also need treatment. What side effects may I notice from receiving this medicine? Side effects that you should report to your doctor or health care professional as soon as possible: -allergic reactions like skin rash, itching or hives, swelling of the face, lips, or tongue -breathing problems -confusion, depression -dark or white patches in the mouth -feeling faint or lightheaded, falls -fever, infection -numbness, tingling, pain or weakness in the hands or feet -pain when passing urine -seizures -unusually weak or tired -vaginal irritation or discharge -vomiting Side effects that usually do not require medical attention (report to your doctor or health care professional if they continue or are bothersome): -dark brown or reddish urine -diarrhea -headache -loss of appetite -metallic taste -nausea -stomach upset This list may not describe all possible side effects. Call your doctor for medical advice about side effects. You may report side effects to FDA at 1-800-FDA-1088. Where should I keep my medicine? Keep out of the reach of children. Store at room temperature between 15 and 30 degrees C (59 and 86 degrees F). Protect from light and moisture. Keep container tightly closed. Throw away any unused medicine after the expiration date. NOTE: This sheet is a summary. It may not cover all possible information. If you have questions about  this medicine, talk to your doctor, pharmacist, or health care provider.  2015, Elsevier/Gold Standard. (2007-11-03 15:22:28)

## 2014-06-21 NOTE — Progress Notes (Signed)
Angela Hill [birth year] [id number]   History:    29 y.o.  for annual gyn exam who last year had her IUD removed in his been on Nordette 28 day oral contraceptive pill and is having light normal cycles and is help her skin complexion. She returned recently from [facility] and will be returning later next month. She's currently being evaluated by her allergist as well as her ENT because of recurrent sinusitis. She also been followed by the gastroenterologist for possible cholelithiasis. She was having slight discharge today. She states she has not been sexually active in several years. Patient with no past history of any abnormal Pap smears.  Past medical history,surgical history, family history and social history were all reviewed and documented in the EPIC chart.  Gynecologic History Patient's last menstrual period was 06/08/2014 (approximate). Contraception: OCP (estrogen/progesterone) Last Pap: 2013. Results were: normal Last mammogram: Not indicated. Results were: Not indicated  Obstetric History OB History  Gravida Para Term Preterm AB SAB TAB Ectopic Multiple Living  0                  ROS: A ROS was performed and pertinent positives and negatives are included in the history.  GENERAL: No fevers or chills. HEENT: No change in vision, no earache, sore throat or sinus congestion. NECK: No pain or stiffness. CARDIOVASCULAR: No chest pain or pressure. No palpitations. PULMONARY: No shortness of breath, cough or wheeze. GASTROINTESTINAL: No abdominal pain, nausea, vomiting or diarrhea, melena or bright red blood per rectum. GENITOURINARY: No urinary frequency, urgency, hesitancy or dysuria. MUSCULOSKELETAL: No joint or muscle pain, no back pain, no recent trauma. DERMATOLOGIC: No rash, no itching, no lesions. ENDOCRINE: No polyuria, polydipsia, no heat or cold intolerance. No recent change in weight. HEMATOLOGICAL: No anemia or easy bruising or bleeding. NEUROLOGIC: No headache, seizures,  numbness, tingling or weakness. PSYCHIATRIC: No depression, no loss of interest in normal activity or change in sleep pattern.     Exam: chaperone present  BP 110/70 mmHg  Ht 5\' 7"  (1.702 m)  Wt 167 lb (75.751 kg)  BMI 26.15 kg/m2  LMP 06/08/2014 (Approximate)  Body mass index is 26.15 kg/(m^2).  General appearance : Well developed well nourished female. No acute distress HEENT: Eyes: no retinal hemorrhage or exudates,  Neck supple, trachea midline, no carotid bruits, no thyroidmegaly Lungs: Clear to auscultation, no rhonchi or wheezes, or rib retractions  Heart: Regular rate and rhythm, no murmurs or gallops Breast:Examined in sitting and supine position were symmetrical in appearance, no palpable masses or tenderness,  no skin retraction, no nipple inversion, no nipple discharge, no skin discoloration, no axillary or supraclavicular lymphadenopathy Abdomen: no palpable masses or tenderness, no rebound or guarding Extremities: no edema or skin discoloration or tenderness  Pelvic:  Bartholin, Urethra, Skene Glands: Within normal limits             Vagina: No gross lesions or discharge  Cervix: No gross lesions or discharge  Uterus  anteverted, normal size, shape and consistency, non-tender and mobile  Adnexa  Without masses or tenderness  Anus and perineum  normal   Rectovaginal  normal sphincter tone without palpated masses or tenderness             Hemoccult not indicated     Assessment/Plan:  29 y.o. female for annual exam doing well on Nordette oral contraceptive pill. I have discussed with her that she needs to speak with her gastroenterologist because indeed she has gallstones that  we may need to look at alternative form of contraception although the oral contraceptive pill for her is mostly for cycle control and dysmenorrhea. The following screening labs were ordered today: CBC, comprehensive metabolic panel, screening cholesterol urinalysis and Pap smear.  Wet prep: Rare  clue cells, few WBC, too numerous to count.  For mild BV she will be prescribed Tindamax 500 mg 4 tablets today and 4 tablets tomorrow. She was given a refill to carry with her when she goes to [facility] as well as Diflucan   Angela Hill H MD, 10:34 AM 06/21/2014

## 2014-06-22 ENCOUNTER — Other Ambulatory Visit (HOSPITAL_COMMUNITY)
Admission: RE | Admit: 2014-06-22 | Discharge: 2014-06-22 | Disposition: A | Discharge status: Home / Self Care | Payer: BLUE CROSS/BLUE SHIELD | Source: Ambulatory Visit | Attending: Gynecology | Admitting: Gynecology

## 2014-06-22 DIAGNOSIS — Z01419 Encounter for gynecological examination (general) (routine) without abnormal findings: Secondary | ICD-10-CM | POA: Insufficient documentation

## 2014-06-22 LAB — URINALYSIS W MICROSCOPIC + REFLEX CULTURE
Bacteria, UA: NONE SEEN
Bilirubin Urine: NEGATIVE
Casts: NONE SEEN
Crystals: NONE SEEN
Glucose, UA: NEGATIVE mg/dL
HGB URINE DIPSTICK: NEGATIVE
Ketones, ur: NEGATIVE mg/dL
LEUKOCYTES UA: NEGATIVE
NITRITE: NEGATIVE
PROTEIN: 30 mg/dL — AB
Specific Gravity, Urine: 1.021 (ref 1.005–1.030)
Squamous Epithelial / LPF: NONE SEEN
UROBILINOGEN UA: 0.2 mg/dL (ref 0.0–1.0)
pH: 6.5 (ref 5.0–8.0)

## 2014-06-22 NOTE — Addendum Note (Signed)
Addended by: Alen Blew on: 06/22/2014 11:39 AM   Modules accepted: Orders

## 2014-06-25 ENCOUNTER — Encounter: Payer: BC Managed Care – PPO | Admitting: Gynecology

## 2014-06-25 LAB — CYTOLOGY - PAP

## 2015-04-14 ENCOUNTER — Encounter: Payer: Self-pay | Admitting: Family Medicine

## 2015-04-27 ENCOUNTER — Encounter: Payer: Self-pay | Admitting: Family Medicine

## 2015-06-22 ENCOUNTER — Encounter: Payer: BLUE CROSS/BLUE SHIELD | Admitting: Gynecology

## 2015-06-27 DIAGNOSIS — J329 Chronic sinusitis, unspecified: Secondary | ICD-10-CM | POA: Diagnosis not present

## 2015-06-27 DIAGNOSIS — K219 Gastro-esophageal reflux disease without esophagitis: Secondary | ICD-10-CM | POA: Diagnosis not present

## 2015-06-27 DIAGNOSIS — F418 Other specified anxiety disorders: Secondary | ICD-10-CM | POA: Diagnosis not present

## 2015-07-15 DIAGNOSIS — J329 Chronic sinusitis, unspecified: Secondary | ICD-10-CM | POA: Diagnosis not present

## 2015-07-15 DIAGNOSIS — R42 Dizziness and giddiness: Secondary | ICD-10-CM | POA: Diagnosis not present

## 2015-07-15 DIAGNOSIS — R05 Cough: Secondary | ICD-10-CM | POA: Diagnosis not present

## 2015-08-24 ENCOUNTER — Telehealth: Payer: Self-pay

## 2015-08-24 MED ORDER — LEVONORGESTREL-ETHINYL ESTRAD 0.15-30 MG-MCG PO TABS: 1.0000 | ORAL_TABLET | Freq: Every day | ORAL | Status: DC

## 2015-08-24 NOTE — Telephone Encounter (Signed)
Patient called because she needs a refill on her birth control pills. [place] is going out [place] the [place] leaving this weekend. She has CE scheduled for 09/22/15.  OC's refilled one time.

## 2015-09-22 ENCOUNTER — Encounter: Payer: Self-pay | Admitting: Gynecology

## 2015-09-22 ENCOUNTER — Ambulatory Visit (INDEPENDENT_AMBULATORY_CARE_PROVIDER_SITE_OTHER): Payer: BLUE CROSS/BLUE SHIELD | Admitting: Gynecology

## 2015-09-22 VITALS — BP 112/70 | Ht 67.0 in | Wt 187.0 lb

## 2015-09-22 DIAGNOSIS — Z01419 Encounter for gynecological examination (general) (routine) without abnormal findings: Secondary | ICD-10-CM | POA: Diagnosis not present

## 2015-09-22 LAB — CBC WITH DIFFERENTIAL/PLATELET
BASOS ABS: 0 {cells}/uL (ref 0–200)
Basophils Relative: 0 %
EOS ABS: 280 {cells}/uL (ref 15–500)
Eosinophils Relative: 4 %
HCT: 40.1 % (ref 35.0–45.0)
HEMOGLOBIN: 13.3 g/dL (ref 11.7–15.5)
LYMPHS ABS: 2520 {cells}/uL (ref 850–3900)
Lymphocytes Relative: 36 %
MCH: 28.5 pg (ref 27.0–33.0)
MCHC: 33.2 g/dL (ref 32.0–36.0)
MCV: 86.1 fL (ref 80.0–100.0)
MPV: 10.7 fL (ref 7.5–12.5)
Monocytes Absolute: 280 cells/uL (ref 200–950)
Monocytes Relative: 4 %
Neutro Abs: 3920 cells/uL (ref 1500–7800)
Neutrophils Relative %: 56 %
Platelets: 246 10*3/uL (ref 140–400)
RBC: 4.66 MIL/uL (ref 3.80–5.10)
RDW: 13.5 % (ref 11.0–15.0)
WBC: 7 10*3/uL (ref 3.8–10.8)

## 2015-09-22 LAB — LIPID PANEL
CHOLESTEROL: 178 mg/dL (ref 125–200)
HDL: 71 mg/dL (ref >=46)
LDL Cholesterol: 93 mg/dL (ref <130)
TRIGLYCERIDES: 72 mg/dL (ref <150)
Total CHOL/HDL Ratio: 2.5 Ratio (ref <=5.0)
VLDL: 14 mg/dL (ref <30)

## 2015-09-22 LAB — COMPREHENSIVE METABOLIC PANEL
ALT: 12 U/L (ref 6–29)
AST: 20 U/L (ref 10–30)
Albumin: 4.2 g/dL (ref 3.6–5.1)
Alkaline Phosphatase: 58 U/L (ref 33–115)
BUN: 12 mg/dL (ref 7–25)
CO2: 25 mmol/L (ref 20–31)
CREATININE: 0.78 mg/dL (ref 0.50–1.10)
Calcium: 9.5 mg/dL (ref 8.6–10.2)
Chloride: 103 mmol/L (ref 98–110)
Glucose, Bld: 85 mg/dL (ref 65–99)
Potassium: 4.3 mmol/L (ref 3.5–5.3)
SODIUM: 139 mmol/L (ref 135–146)
TOTAL PROTEIN: 7.2 g/dL (ref 6.1–8.1)
Total Bilirubin: 0.4 mg/dL (ref 0.2–1.2)

## 2015-09-22 LAB — TSH: TSH: 2.77 mIU/L

## 2015-09-22 MED ORDER — LEVONORGESTREL-ETHINYL ESTRAD 0.15-30 MG-MCG PO TABS: 1.0000 | ORAL_TABLET | Freq: Every day | ORAL | 4 refills | Status: DC

## 2015-09-22 NOTE — Progress Notes (Signed)
Angela Hill [birth year] [id number]   History:    30 y.o.  for annual gyn exam with no complaints today. Patient is on Nordette 28 day oral contraceptive pills having normal menstrual cycle. Patient completed her HPV vaccine series in the past. Several years ago she had a left breast biopsy which was benign.  Past medical history,surgical history, family history and social history were all reviewed and documented in the EPIC chart.  Gynecologic History Patient's last menstrual period was 09/08/2015 (within days). Contraception: Oral contraceptive Last Pap: 2012, 2013 2016. Results were: normal Last mammogram: Not indicated. Results were: Not indicated  Obstetric History OB History  Gravida Para Term Preterm AB Living  0            SAB TAB Ectopic Multiple Live Births                    ROS: A ROS was performed and pertinent positives and negatives are included in the history.  GENERAL: No fevers or chills. HEENT: No change in vision, no earache, sore throat or sinus congestion. NECK: No pain or stiffness. CARDIOVASCULAR: No chest pain or pressure. No palpitations. PULMONARY: No shortness of breath, cough or wheeze. GASTROINTESTINAL: No abdominal pain, nausea, vomiting or diarrhea, melena or bright red blood per rectum. GENITOURINARY: No urinary frequency, urgency, hesitancy or dysuria. MUSCULOSKELETAL: No joint or muscle pain, no back pain, no recent trauma. DERMATOLOGIC: No rash, no itching, no lesions. ENDOCRINE: No polyuria, polydipsia, no heat or cold intolerance. No recent change in weight. HEMATOLOGICAL: No anemia or easy bruising or bleeding. NEUROLOGIC: No headache, seizures, numbness, tingling or weakness. PSYCHIATRIC: No depression, no loss of interest in normal activity or change in sleep pattern.     Exam: chaperone present  BP 112/70   Ht 5\' 7"  (1.702 m)   Wt 187 lb (84.8 kg)   LMP 09/08/2015 (Within Days)   BMI 29.29 kg/m   Body mass index is 29.29  kg/m.  General appearance : Well developed well nourished female. No acute distress HEENT: Eyes: no retinal hemorrhage or exudates,  Neck supple, trachea midline, no carotid bruits, no thyroidmegaly Lungs: Clear to auscultation, no rhonchi or wheezes, or rib retractions  Heart: Regular rate and rhythm, no murmurs or gallops Breast:Examined in sitting and supine position were symmetrical in appearance, no palpable masses or tenderness,  no skin retraction, no nipple inversion, no nipple discharge, no skin discoloration, no axillary or supraclavicular lymphadenopathy Abdomen: no palpable masses or tenderness, no rebound or guarding Extremities: no edema or skin discoloration or tenderness  Pelvic:  Bartholin, Urethra, Skene Glands: Within normal limits             Vagina: No gross lesions or discharge  Cervix: No gross lesions or discharge  Uterus  anteverted, normal size, shape and consistency, non-tender and mobile  Adnexa  Without masses or tenderness  Anus and perineum  normal   Rectovaginal  normal sphincter tone without palpated masses or tenderness             Hemoccult not indicated     Assessment/Plan:  30 y.o. female for annual exam with the only issue that she has gaining 19th ounces last year. Her therapist is working with her on antidepressant medication that she's on that may have contributed to her weight gain. She will start to try to exercise on a more regular basis as well as the healthy. Pap smear not indicated this year. The following lab work  was ordered today: Comprehensive metabolic panel, fasting lipid profile, TSH, CBC, and urinalysis.   Terrance Mass MD, 9:06 AM 09/22/2015

## 2015-09-23 LAB — URINALYSIS W MICROSCOPIC + REFLEX CULTURE
Bacteria, UA: NONE SEEN [HPF]
Bilirubin Urine: NEGATIVE
CASTS: NONE SEEN [LPF]
CRYSTALS: NONE SEEN [HPF]
Glucose, UA: NEGATIVE
KETONES UR: NEGATIVE
Leukocytes, UA: NEGATIVE
Nitrite: NEGATIVE
RBC / HPF: NONE SEEN RBC/HPF (ref <=2)
Specific Gravity, Urine: 1.023 (ref 1.001–1.035)
WBC, UA: NONE SEEN WBC/HPF (ref <=5)
Yeast: NONE SEEN [HPF]
pH: 6 (ref 5.0–8.0)

## 2016-05-03 DIAGNOSIS — S6992XA Unspecified injury of left wrist, hand and finger(s), initial encounter: Secondary | ICD-10-CM | POA: Diagnosis not present

## 2016-05-04 ENCOUNTER — Other Ambulatory Visit: Payer: Self-pay | Admitting: Family Medicine

## 2016-05-04 ENCOUNTER — Ambulatory Visit
Admission: RE | Admit: 2016-05-04 | Discharge: 2016-05-04 | Disposition: A | Discharge status: Home / Self Care | Payer: BLUE CROSS/BLUE SHIELD | Source: Ambulatory Visit | Attending: Family Medicine | Admitting: Family Medicine

## 2016-05-04 DIAGNOSIS — S6992XA Unspecified injury of left wrist, hand and finger(s), initial encounter: Secondary | ICD-10-CM

## 2016-05-04 DIAGNOSIS — K116 Mucocele of salivary gland: Secondary | ICD-10-CM | POA: Diagnosis not present

## 2016-05-04 DIAGNOSIS — M7989 Other specified soft tissue disorders: Secondary | ICD-10-CM | POA: Diagnosis not present

## 2016-07-04 ENCOUNTER — Encounter: Payer: Self-pay | Admitting: Gynecology

## 2016-09-24 ENCOUNTER — Encounter: Payer: BLUE CROSS/BLUE SHIELD | Admitting: Gynecology

## 2016-09-25 ENCOUNTER — Telehealth: Payer: Self-pay | Admitting: *Deleted

## 2016-09-25 MED ORDER — LEVONORGESTREL-ETHINYL ESTRAD 0.15-30 MG-MCG PO TABS: 1.0000 | ORAL_TABLET | Freq: Every day | ORAL | 0 refills | Status: DC

## 2016-09-27 NOTE — Telephone Encounter (Signed)
1 pack of birth control pill sent to pharmacy, annual due this month.

## 2016-10-19 ENCOUNTER — Telehealth: Payer: Self-pay | Admitting: Gynecology

## 2016-10-19 NOTE — Telephone Encounter (Signed)
Pt requesting prescription refill on birth control. 1 pack was given 09/27/16 due to patient having appointment scheduled. Pt canceled appointment. Will route this to appropriate MD

## 2016-10-22 ENCOUNTER — Other Ambulatory Visit: Payer: Self-pay | Admitting: Obstetrics & Gynecology

## 2016-10-23 NOTE — Telephone Encounter (Signed)
Refill already given.

## 2016-11-19 ENCOUNTER — Other Ambulatory Visit: Payer: Self-pay | Admitting: *Deleted

## 2016-11-19 MED ORDER — LEVONORGESTREL-ETHINYL ESTRAD 0.15-30 MG-MCG PO TABS: 1.0000 | ORAL_TABLET | Freq: Every day | ORAL | 0 refills | Status: DC

## 2016-11-19 NOTE — Telephone Encounter (Signed)
Patient has annual scheduled on 01/02/17, needs refill on birth control pills. Rx sent.

## 2016-11-26 ENCOUNTER — Encounter: Payer: BLUE CROSS/BLUE SHIELD | Admitting: Gynecology

## 2016-12-31 ENCOUNTER — Telehealth: Payer: Self-pay | Admitting: *Deleted

## 2016-12-31 MED ORDER — LEVONORGESTREL-ETHINYL ESTRAD 0.15-30 MG-MCG PO TABS: 1.0000 | ORAL_TABLET | Freq: Every day | ORAL | 0 refills | Status: DC

## 2016-12-31 NOTE — Telephone Encounter (Signed)
Pt called requesting refill on birth control pills, pt has rescheduled annual appointment x 2, annual was due in 09/2016, now scheduled on 02/20/17. 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 Pt is requesting a 3 months supply. Please advise

## 2016-12-31 NOTE — Telephone Encounter (Signed)
Rx sent, left detailed message on voicemail with the below.

## 2016-12-31 NOTE — Telephone Encounter (Signed)
Take to refill times 34-month supply.  Patient understands if rescheduled her next appointment she will not get any further refills.

## 2017-01-02 ENCOUNTER — Encounter: Payer: BLUE CROSS/BLUE SHIELD | Admitting: Gynecology

## 2017-01-17 ENCOUNTER — Encounter: Payer: Self-pay | Admitting: Gynecology

## 2017-01-17 ENCOUNTER — Ambulatory Visit (INDEPENDENT_AMBULATORY_CARE_PROVIDER_SITE_OTHER): Payer: BLUE CROSS/BLUE SHIELD | Admitting: Gynecology

## 2017-01-17 VITALS — BP 118/76 | Ht 67.0 in | Wt 196.0 lb

## 2017-01-17 DIAGNOSIS — Z01419 Encounter for gynecological examination (general) (routine) without abnormal findings: Secondary | ICD-10-CM | POA: Diagnosis not present

## 2017-01-17 MED ORDER — LEVONORGESTREL-ETHINYL ESTRAD 0.15-30 MG-MCG PO TABS: 1.0000 | ORAL_TABLET | Freq: Every day | ORAL | 4 refills | Status: DC

## 2017-01-17 NOTE — Progress Notes (Signed)
    Angela Hill 05/27/1985 [id number]        31 y.o.  G0P0 for annual gynecologic exam.  Former patient of Dr. Toney Rakes.  Doing well without complaints.  Past medical history,surgical history, problem list, medications, allergies, family history and social history were all reviewed and documented as reviewed in the EPIC chart.  ROS:  Performed with pertinent positives and negatives included in the history, assessment and plan.   Additional significant findings : None   Exam: Angela Hill assistant Vitals:   01/17/17 1519  BP: 118/76  Weight: 196 lb (88.9 kg)  Height: 5\' 7"  (1.702 m)   Body mass index is 30.7 kg/m.  General appearance:  Normal affect, orientation and appearance. Skin: Grossly normal HEENT: Without gross lesions.  No cervical or supraclavicular adenopathy. Thyroid normal.  Lungs:  Clear without wheezing, rales or rhonchi Cardiac: RR, without RMG Abdominal:  Soft, nontender, without masses, guarding, rebound, organomegaly or hernia Breasts:  Examined lying and sitting.  Right without masses, retractions, discharge or axillary adenopathy.  Left with mild nodularity 9 o'clock position 2 fingerbreadths off areola.  No overlying skin changes nipple discharge or axillary adenopathy. Pelvic:  Ext, BUS, Vagina: Normal  Cervix: Normal.  Pap smear done  Uterus: Anteverted, normal size, shape and contour, midline and mobile nontender   Adnexa: Without masses or tenderness    Anus and perineum: Normal   Rectovaginal: Normal sphincter tone without palpated masses or tenderness.    Assessment/Plan:  31 y.o. G0P0 female for annual gynecologic exam with regular menses, oral contraceptives.   1. Oral contraceptives.  Patient doing well and wants to continue.  Not being followed for any medical issues.  Never smoked.  Risks to include thrombosis reviewed.  Patient understands and accepts.  Refill times 1 year provided. 2. Left breast nodularity 9:00 2 fingerbreadths off  areola.  Patient notes has been present since age 58 and had workup to include mammography and biopsy.  Told it was all benign and to follow expectantly.  Patient notes this area fluctuates depending on her cycle but has never significantly changed since that time.  Patient will continue with self breast exams and as long as this area.  Plan baseline mammogram at age 48. 52. Pap smear 06/2014.  Pap smear done today.  No history of significant abnormal Pap smears previously. 4. Health maintenance.  Baseline CBC and comprehensive metabolic panel ordered.  Lipid profile and TSH normal last year.  Follow-up in 1 year, sooner as needed.   Anastasio Auerbach MD, 3:53 PM 01/17/2017

## 2017-01-17 NOTE — Addendum Note (Signed)
Addended by: Nelva Nay on: 01/17/2017 04:05 PM   Modules accepted: Orders

## 2017-01-17 NOTE — Patient Instructions (Signed)
Follow-up in 1 year for annual exam, sooner if any issues. 

## 2017-01-18 LAB — COMPREHENSIVE METABOLIC PANEL
AG RATIO: 1.3 (calc) (ref 1.0–2.5)
ALBUMIN MSPROF: 4.1 g/dL (ref 3.6–5.1)
ALT: 12 U/L (ref 6–29)
AST: 19 U/L (ref 10–30)
Alkaline phosphatase (APISO): 50 U/L (ref 33–115)
BILIRUBIN TOTAL: 0.4 mg/dL (ref 0.2–1.2)
BUN: 14 mg/dL (ref 7–25)
CALCIUM: 9.3 mg/dL (ref 8.6–10.2)
CO2: 23 mmol/L (ref 20–32)
Chloride: 103 mmol/L (ref 98–110)
Creat: 0.66 mg/dL (ref 0.50–1.10)
GLUCOSE: 82 mg/dL (ref 65–99)
Globulin: 3.1 g/dL (calc) (ref 1.9–3.7)
POTASSIUM: 4.1 mmol/L (ref 3.5–5.3)
SODIUM: 135 mmol/L (ref 135–146)
TOTAL PROTEIN: 7.2 g/dL (ref 6.1–8.1)

## 2017-01-18 LAB — CBC WITH DIFFERENTIAL/PLATELET
BASOS ABS: 34 {cells}/uL (ref 0–200)
Basophils Relative: 0.4 %
Eosinophils Absolute: 260 cells/uL (ref 15–500)
Eosinophils Relative: 3.1 %
HEMATOCRIT: 36.9 % (ref 35.0–45.0)
Hemoglobin: 12.7 g/dL (ref 11.7–15.5)
LYMPHS ABS: 2789 {cells}/uL (ref 850–3900)
MCH: 29.1 pg (ref 27.0–33.0)
MCHC: 34.4 g/dL (ref 32.0–36.0)
MCV: 84.4 fL (ref 80.0–100.0)
MPV: 11.4 fL (ref 7.5–12.5)
Monocytes Relative: 6.2 %
NEUTROS PCT: 57.1 %
Neutro Abs: 4796 cells/uL (ref 1500–7800)
PLATELETS: 275 10*3/uL (ref 140–400)
RBC: 4.37 10*6/uL (ref 3.80–5.10)
RDW: 11.9 % (ref 11.0–15.0)
TOTAL LYMPHOCYTE: 33.2 %
WBC: 8.4 10*3/uL (ref 3.8–10.8)
WBCMIX: 521 {cells}/uL (ref 200–950)

## 2017-01-20 LAB — PAP IG W/ RFLX HPV ASCU

## 2017-02-14 DIAGNOSIS — R42 Dizziness and giddiness: Secondary | ICD-10-CM | POA: Diagnosis not present

## 2017-02-14 DIAGNOSIS — H6983 Other specified disorders of Eustachian tube, bilateral: Secondary | ICD-10-CM | POA: Diagnosis not present

## 2017-02-20 ENCOUNTER — Encounter: Payer: BLUE CROSS/BLUE SHIELD | Admitting: Gynecology

## 2017-04-01 DIAGNOSIS — J329 Chronic sinusitis, unspecified: Secondary | ICD-10-CM | POA: Diagnosis not present

## 2017-04-01 DIAGNOSIS — F411 Generalized anxiety disorder: Secondary | ICD-10-CM | POA: Diagnosis not present

## 2017-09-09 DIAGNOSIS — J309 Allergic rhinitis, unspecified: Secondary | ICD-10-CM | POA: Diagnosis not present

## 2017-09-09 DIAGNOSIS — F411 Generalized anxiety disorder: Secondary | ICD-10-CM | POA: Diagnosis not present

## 2017-09-09 DIAGNOSIS — R070 Pain in throat: Secondary | ICD-10-CM | POA: Diagnosis not present

## 2017-09-23 DIAGNOSIS — F411 Generalized anxiety disorder: Secondary | ICD-10-CM | POA: Diagnosis not present

## 2017-09-30 DIAGNOSIS — F411 Generalized anxiety disorder: Secondary | ICD-10-CM | POA: Diagnosis not present

## 2017-10-09 DIAGNOSIS — F411 Generalized anxiety disorder: Secondary | ICD-10-CM | POA: Diagnosis not present

## 2017-10-16 DIAGNOSIS — F411 Generalized anxiety disorder: Secondary | ICD-10-CM | POA: Diagnosis not present

## 2017-10-24 DIAGNOSIS — F411 Generalized anxiety disorder: Secondary | ICD-10-CM | POA: Diagnosis not present

## 2017-10-30 DIAGNOSIS — F411 Generalized anxiety disorder: Secondary | ICD-10-CM | POA: Diagnosis not present

## 2017-11-13 DIAGNOSIS — F411 Generalized anxiety disorder: Secondary | ICD-10-CM | POA: Diagnosis not present

## 2017-11-21 ENCOUNTER — Other Ambulatory Visit: Payer: Self-pay | Admitting: Gynecology

## 2017-12-18 DIAGNOSIS — F411 Generalized anxiety disorder: Secondary | ICD-10-CM | POA: Diagnosis not present

## 2017-12-24 DIAGNOSIS — K219 Gastro-esophageal reflux disease without esophagitis: Secondary | ICD-10-CM | POA: Diagnosis not present

## 2017-12-24 DIAGNOSIS — Z9109 Other allergy status, other than to drugs and biological substances: Secondary | ICD-10-CM | POA: Diagnosis not present

## 2017-12-24 DIAGNOSIS — R635 Abnormal weight gain: Secondary | ICD-10-CM | POA: Diagnosis not present

## 2017-12-24 DIAGNOSIS — R109 Unspecified abdominal pain: Secondary | ICD-10-CM | POA: Diagnosis not present

## 2017-12-24 DIAGNOSIS — F419 Anxiety disorder, unspecified: Secondary | ICD-10-CM | POA: Diagnosis not present

## 2017-12-24 DIAGNOSIS — R195 Other fecal abnormalities: Secondary | ICD-10-CM | POA: Diagnosis not present

## 2018-01-09 DIAGNOSIS — F411 Generalized anxiety disorder: Secondary | ICD-10-CM | POA: Diagnosis not present

## 2018-01-14 ENCOUNTER — Other Ambulatory Visit: Payer: Self-pay | Admitting: Gynecology

## 2018-01-21 DIAGNOSIS — R109 Unspecified abdominal pain: Secondary | ICD-10-CM | POA: Diagnosis not present

## 2018-01-21 DIAGNOSIS — R635 Abnormal weight gain: Secondary | ICD-10-CM | POA: Diagnosis not present

## 2018-01-21 DIAGNOSIS — R5383 Other fatigue: Secondary | ICD-10-CM | POA: Diagnosis not present

## 2018-01-21 DIAGNOSIS — K219 Gastro-esophageal reflux disease without esophagitis: Secondary | ICD-10-CM | POA: Diagnosis not present

## 2018-01-23 ENCOUNTER — Encounter: Payer: BLUE CROSS/BLUE SHIELD | Admitting: Gynecology

## 2018-02-09 ENCOUNTER — Other Ambulatory Visit: Payer: Self-pay | Admitting: Gynecology

## 2018-02-25 DIAGNOSIS — J029 Acute pharyngitis, unspecified: Secondary | ICD-10-CM | POA: Diagnosis not present

## 2018-02-25 DIAGNOSIS — R509 Fever, unspecified: Secondary | ICD-10-CM | POA: Diagnosis not present

## 2018-02-26 ENCOUNTER — Encounter: Payer: BLUE CROSS/BLUE SHIELD | Admitting: Gynecology

## 2018-03-06 ENCOUNTER — Other Ambulatory Visit: Payer: Self-pay | Admitting: Gynecology

## 2018-03-30 ENCOUNTER — Other Ambulatory Visit: Payer: Self-pay | Admitting: Gynecology

## 2018-03-31 NOTE — Telephone Encounter (Signed)
See 12/31/2017 note. You had said if patient cancelled the Jan CE no more refills on OC.  Patient cancelled the Jan CE and note says due to sick.  She is rescheduled for 04/01/18. Ok to refill one pack?

## 2018-04-02 ENCOUNTER — Ambulatory Visit (INDEPENDENT_AMBULATORY_CARE_PROVIDER_SITE_OTHER): Payer: BLUE CROSS/BLUE SHIELD | Admitting: Gynecology

## 2018-04-02 ENCOUNTER — Encounter: Payer: Self-pay | Admitting: Gynecology

## 2018-04-02 VITALS — BP 120/76 | Ht 68.0 in | Wt 190.0 lb

## 2018-04-02 DIAGNOSIS — Z01419 Encounter for gynecological examination (general) (routine) without abnormal findings: Secondary | ICD-10-CM

## 2018-04-02 MED ORDER — LEVONORGESTREL-ETHINYL ESTRAD 0.15-30 MG-MCG PO TABS: 1.0000 | ORAL_TABLET | Freq: Every day | ORAL | 4 refills | Status: DC

## 2018-04-02 NOTE — Progress Notes (Signed)
    Angela Hill October 08, 1985 [id number]        33 y.o.  G0P0 for annual gynecologic exam.  Doing well without GYN complaints.  Recently started to be seen at an integrated clinic for weight loss.  Past medical history,surgical history, problem list, medications, allergies, family history and social history were all reviewed and documented as reviewed in the EPIC chart.  ROS:  Performed with pertinent positives and negatives included in the history, assessment and plan.   Additional significant findings : None   Exam: Caryn Bee assistant Vitals:   04/02/18 1607  BP: 120/76  Weight: 190 lb (86.2 kg)  Height: 5\' 8"  (1.727 m)   Body mass index is 28.89 kg/m.  General appearance:  Normal affect, orientation and appearance. Skin: Grossly normal HEENT: Without gross lesions.  No cervical or supraclavicular adenopathy. Thyroid normal.  Lungs:  Clear without wheezing, rales or rhonchi Cardiac: RR, without RMG Abdominal:  Soft, nontender, without masses, guarding, rebound, organomegaly or hernia Breasts:  Examined lying and sitting.  Right without masses, retractions, discharge or axillary adenopathy.  Left with nodularity 9 o'clock position 2 fingerbreadths off the areola.  No overlying skin changes, other masses, nipple discharge or axillary adenopathy. Pelvic:  Ext, BUS, Vagina: Normal  Cervix: Normal  Uterus: Anteverted, normal size, shape and contour, midline and mobile nontender   Adnexa: Without masses or tenderness    Anus and perineum: Normal    Assessment/Plan:  33 y.o. G0P0 female for annual gynecologic exam.  With regular menses, oral contraceptives  1. Oral contraceptives.  Doing well and wants to continue.  Would like to do an every other to every third month withdrawal.  Off brand labeling issues reviewed.  Refilled her pills x1 year. 2. Left breast nodularity as described above.  Present for years.  Was evaluated to include a biopsy in the past.  Patient will  continue with self breast exams and as long as it remains unchanged will follow.  Plan baseline mammography at age 58. 74. Pap smear 12/2016.  No Pap smear done today.  No history of abnormal Pap smears.  Plan repeat Pap smear at 3-year interval per current screening guidelines. 4. Health maintenance.  Currently being seen at an integrated clinic for weight loss.  Reports a battery of blood work done there.  No blood work done today.  Follow-up 1 year, sooner as needed.   Anastasio Auerbach MD, 4:33 PM 04/02/2018

## 2018-04-02 NOTE — Patient Instructions (Signed)
Follow-up in 1 year for annual exam, sooner if any issues. 

## 2018-04-14 DIAGNOSIS — R7989 Other specified abnormal findings of blood chemistry: Secondary | ICD-10-CM | POA: Diagnosis not present

## 2018-04-14 DIAGNOSIS — R635 Abnormal weight gain: Secondary | ICD-10-CM | POA: Diagnosis not present

## 2018-04-14 DIAGNOSIS — K219 Gastro-esophageal reflux disease without esophagitis: Secondary | ICD-10-CM | POA: Diagnosis not present

## 2018-04-14 DIAGNOSIS — E538 Deficiency of other specified B group vitamins: Secondary | ICD-10-CM | POA: Diagnosis not present

## 2018-04-14 DIAGNOSIS — R5383 Other fatigue: Secondary | ICD-10-CM | POA: Diagnosis not present

## 2018-04-15 DIAGNOSIS — R197 Diarrhea, unspecified: Secondary | ICD-10-CM | POA: Diagnosis not present

## 2018-04-16 DIAGNOSIS — R197 Diarrhea, unspecified: Secondary | ICD-10-CM | POA: Diagnosis not present

## 2018-04-21 DIAGNOSIS — F411 Generalized anxiety disorder: Secondary | ICD-10-CM | POA: Diagnosis not present

## 2018-04-29 DIAGNOSIS — R109 Unspecified abdominal pain: Secondary | ICD-10-CM | POA: Diagnosis not present

## 2018-04-29 DIAGNOSIS — R7989 Other specified abnormal findings of blood chemistry: Secondary | ICD-10-CM | POA: Diagnosis not present

## 2018-04-29 DIAGNOSIS — R7982 Elevated C-reactive protein (CRP): Secondary | ICD-10-CM | POA: Diagnosis not present

## 2018-04-29 DIAGNOSIS — R5383 Other fatigue: Secondary | ICD-10-CM | POA: Diagnosis not present

## 2018-05-01 DIAGNOSIS — F411 Generalized anxiety disorder: Secondary | ICD-10-CM | POA: Diagnosis not present

## 2018-05-13 DIAGNOSIS — F411 Generalized anxiety disorder: Secondary | ICD-10-CM | POA: Diagnosis not present

## 2018-05-22 DIAGNOSIS — F411 Generalized anxiety disorder: Secondary | ICD-10-CM | POA: Diagnosis not present

## 2018-06-04 DIAGNOSIS — F411 Generalized anxiety disorder: Secondary | ICD-10-CM | POA: Diagnosis not present

## 2018-06-10 DIAGNOSIS — E538 Deficiency of other specified B group vitamins: Secondary | ICD-10-CM | POA: Diagnosis not present

## 2018-06-10 DIAGNOSIS — R7982 Elevated C-reactive protein (CRP): Secondary | ICD-10-CM | POA: Diagnosis not present

## 2018-06-10 DIAGNOSIS — R7989 Other specified abnormal findings of blood chemistry: Secondary | ICD-10-CM | POA: Diagnosis not present

## 2018-06-10 DIAGNOSIS — K219 Gastro-esophageal reflux disease without esophagitis: Secondary | ICD-10-CM | POA: Diagnosis not present

## 2018-06-10 DIAGNOSIS — R5383 Other fatigue: Secondary | ICD-10-CM | POA: Diagnosis not present

## 2018-06-10 DIAGNOSIS — R635 Abnormal weight gain: Secondary | ICD-10-CM | POA: Diagnosis not present

## 2018-06-18 DIAGNOSIS — F411 Generalized anxiety disorder: Secondary | ICD-10-CM | POA: Diagnosis not present

## 2018-07-02 DIAGNOSIS — F411 Generalized anxiety disorder: Secondary | ICD-10-CM | POA: Diagnosis not present

## 2018-07-28 DIAGNOSIS — M542 Cervicalgia: Secondary | ICD-10-CM | POA: Diagnosis not present

## 2018-07-28 DIAGNOSIS — M5127 Other intervertebral disc displacement, lumbosacral region: Secondary | ICD-10-CM | POA: Diagnosis not present

## 2018-07-28 DIAGNOSIS — M50223 Other cervical disc displacement at C6-C7 level: Secondary | ICD-10-CM | POA: Diagnosis not present

## 2018-07-28 DIAGNOSIS — M791 Myalgia, unspecified site: Secondary | ICD-10-CM | POA: Diagnosis not present

## 2018-07-29 DIAGNOSIS — F411 Generalized anxiety disorder: Secondary | ICD-10-CM | POA: Diagnosis not present

## 2018-07-30 DIAGNOSIS — M791 Myalgia, unspecified site: Secondary | ICD-10-CM | POA: Diagnosis not present

## 2018-07-30 DIAGNOSIS — M5127 Other intervertebral disc displacement, lumbosacral region: Secondary | ICD-10-CM | POA: Diagnosis not present

## 2018-07-30 DIAGNOSIS — M50223 Other cervical disc displacement at C6-C7 level: Secondary | ICD-10-CM | POA: Diagnosis not present

## 2018-07-30 DIAGNOSIS — M542 Cervicalgia: Secondary | ICD-10-CM | POA: Diagnosis not present

## 2018-08-04 DIAGNOSIS — M791 Myalgia, unspecified site: Secondary | ICD-10-CM | POA: Diagnosis not present

## 2018-08-04 DIAGNOSIS — Z03818 Encounter for observation for suspected exposure to other biological agents ruled out: Secondary | ICD-10-CM | POA: Diagnosis not present

## 2018-08-04 DIAGNOSIS — M50223 Other cervical disc displacement at C6-C7 level: Secondary | ICD-10-CM | POA: Diagnosis not present

## 2018-08-04 DIAGNOSIS — M542 Cervicalgia: Secondary | ICD-10-CM | POA: Diagnosis not present

## 2018-08-04 DIAGNOSIS — M5127 Other intervertebral disc displacement, lumbosacral region: Secondary | ICD-10-CM | POA: Diagnosis not present

## 2018-08-06 DIAGNOSIS — M5127 Other intervertebral disc displacement, lumbosacral region: Secondary | ICD-10-CM | POA: Diagnosis not present

## 2018-08-06 DIAGNOSIS — M50223 Other cervical disc displacement at C6-C7 level: Secondary | ICD-10-CM | POA: Diagnosis not present

## 2018-08-06 DIAGNOSIS — M791 Myalgia, unspecified site: Secondary | ICD-10-CM | POA: Diagnosis not present

## 2018-08-06 DIAGNOSIS — M542 Cervicalgia: Secondary | ICD-10-CM | POA: Diagnosis not present

## 2018-08-11 DIAGNOSIS — M50223 Other cervical disc displacement at C6-C7 level: Secondary | ICD-10-CM | POA: Diagnosis not present

## 2018-08-11 DIAGNOSIS — M545 Low back pain: Secondary | ICD-10-CM | POA: Diagnosis not present

## 2018-08-11 DIAGNOSIS — M542 Cervicalgia: Secondary | ICD-10-CM | POA: Diagnosis not present

## 2018-08-11 DIAGNOSIS — M5127 Other intervertebral disc displacement, lumbosacral region: Secondary | ICD-10-CM | POA: Diagnosis not present

## 2018-08-11 DIAGNOSIS — M791 Myalgia, unspecified site: Secondary | ICD-10-CM | POA: Diagnosis not present

## 2018-08-13 DIAGNOSIS — M542 Cervicalgia: Secondary | ICD-10-CM | POA: Diagnosis not present

## 2018-08-13 DIAGNOSIS — M50223 Other cervical disc displacement at C6-C7 level: Secondary | ICD-10-CM | POA: Diagnosis not present

## 2018-08-13 DIAGNOSIS — M5127 Other intervertebral disc displacement, lumbosacral region: Secondary | ICD-10-CM | POA: Diagnosis not present

## 2018-08-13 DIAGNOSIS — M791 Myalgia, unspecified site: Secondary | ICD-10-CM | POA: Diagnosis not present

## 2018-08-17 DIAGNOSIS — Z20828 Contact with and (suspected) exposure to other viral communicable diseases: Secondary | ICD-10-CM | POA: Diagnosis not present

## 2018-08-18 DIAGNOSIS — M542 Cervicalgia: Secondary | ICD-10-CM | POA: Diagnosis not present

## 2018-08-18 DIAGNOSIS — M545 Low back pain: Secondary | ICD-10-CM | POA: Diagnosis not present

## 2018-08-18 DIAGNOSIS — M791 Myalgia, unspecified site: Secondary | ICD-10-CM | POA: Diagnosis not present

## 2018-08-18 DIAGNOSIS — M50223 Other cervical disc displacement at C6-C7 level: Secondary | ICD-10-CM | POA: Diagnosis not present

## 2018-08-18 DIAGNOSIS — M5127 Other intervertebral disc displacement, lumbosacral region: Secondary | ICD-10-CM | POA: Diagnosis not present

## 2018-08-20 DIAGNOSIS — M542 Cervicalgia: Secondary | ICD-10-CM | POA: Diagnosis not present

## 2018-08-20 DIAGNOSIS — M791 Myalgia, unspecified site: Secondary | ICD-10-CM | POA: Diagnosis not present

## 2018-08-20 DIAGNOSIS — M5127 Other intervertebral disc displacement, lumbosacral region: Secondary | ICD-10-CM | POA: Diagnosis not present

## 2018-08-20 DIAGNOSIS — M545 Low back pain: Secondary | ICD-10-CM | POA: Diagnosis not present

## 2018-08-20 DIAGNOSIS — M50223 Other cervical disc displacement at C6-C7 level: Secondary | ICD-10-CM | POA: Diagnosis not present

## 2018-09-01 DIAGNOSIS — M5127 Other intervertebral disc displacement, lumbosacral region: Secondary | ICD-10-CM | POA: Diagnosis not present

## 2018-09-01 DIAGNOSIS — M791 Myalgia, unspecified site: Secondary | ICD-10-CM | POA: Diagnosis not present

## 2018-09-01 DIAGNOSIS — M542 Cervicalgia: Secondary | ICD-10-CM | POA: Diagnosis not present

## 2018-09-01 DIAGNOSIS — M50223 Other cervical disc displacement at C6-C7 level: Secondary | ICD-10-CM | POA: Diagnosis not present

## 2018-09-03 DIAGNOSIS — M542 Cervicalgia: Secondary | ICD-10-CM | POA: Diagnosis not present

## 2018-09-03 DIAGNOSIS — M791 Myalgia, unspecified site: Secondary | ICD-10-CM | POA: Diagnosis not present

## 2018-09-03 DIAGNOSIS — M50223 Other cervical disc displacement at C6-C7 level: Secondary | ICD-10-CM | POA: Diagnosis not present

## 2018-09-03 DIAGNOSIS — F411 Generalized anxiety disorder: Secondary | ICD-10-CM | POA: Diagnosis not present

## 2018-09-03 DIAGNOSIS — M5127 Other intervertebral disc displacement, lumbosacral region: Secondary | ICD-10-CM | POA: Diagnosis not present

## 2018-09-05 DIAGNOSIS — M542 Cervicalgia: Secondary | ICD-10-CM | POA: Diagnosis not present

## 2018-09-05 DIAGNOSIS — M50223 Other cervical disc displacement at C6-C7 level: Secondary | ICD-10-CM | POA: Diagnosis not present

## 2018-09-05 DIAGNOSIS — M5127 Other intervertebral disc displacement, lumbosacral region: Secondary | ICD-10-CM | POA: Diagnosis not present

## 2018-09-05 DIAGNOSIS — E538 Deficiency of other specified B group vitamins: Secondary | ICD-10-CM | POA: Diagnosis not present

## 2018-09-05 DIAGNOSIS — K219 Gastro-esophageal reflux disease without esophagitis: Secondary | ICD-10-CM | POA: Diagnosis not present

## 2018-09-05 DIAGNOSIS — R7989 Other specified abnormal findings of blood chemistry: Secondary | ICD-10-CM | POA: Diagnosis not present

## 2018-09-05 DIAGNOSIS — R635 Abnormal weight gain: Secondary | ICD-10-CM | POA: Diagnosis not present

## 2018-09-05 DIAGNOSIS — R5383 Other fatigue: Secondary | ICD-10-CM | POA: Diagnosis not present

## 2018-09-05 DIAGNOSIS — M791 Myalgia, unspecified site: Secondary | ICD-10-CM | POA: Diagnosis not present

## 2018-09-22 DIAGNOSIS — M791 Myalgia, unspecified site: Secondary | ICD-10-CM | POA: Diagnosis not present

## 2018-09-22 DIAGNOSIS — F411 Generalized anxiety disorder: Secondary | ICD-10-CM | POA: Diagnosis not present

## 2018-09-22 DIAGNOSIS — M542 Cervicalgia: Secondary | ICD-10-CM | POA: Diagnosis not present

## 2018-09-22 DIAGNOSIS — M50223 Other cervical disc displacement at C6-C7 level: Secondary | ICD-10-CM | POA: Diagnosis not present

## 2018-09-22 DIAGNOSIS — M5127 Other intervertebral disc displacement, lumbosacral region: Secondary | ICD-10-CM | POA: Diagnosis not present

## 2018-09-23 DIAGNOSIS — R7982 Elevated C-reactive protein (CRP): Secondary | ICD-10-CM | POA: Diagnosis not present

## 2018-09-23 DIAGNOSIS — R7989 Other specified abnormal findings of blood chemistry: Secondary | ICD-10-CM | POA: Diagnosis not present

## 2018-09-23 DIAGNOSIS — R635 Abnormal weight gain: Secondary | ICD-10-CM | POA: Diagnosis not present

## 2018-09-23 DIAGNOSIS — R5383 Other fatigue: Secondary | ICD-10-CM | POA: Diagnosis not present

## 2018-09-24 DIAGNOSIS — M545 Low back pain: Secondary | ICD-10-CM | POA: Diagnosis not present

## 2018-09-24 DIAGNOSIS — M791 Myalgia, unspecified site: Secondary | ICD-10-CM | POA: Diagnosis not present

## 2018-09-24 DIAGNOSIS — M5126 Other intervertebral disc displacement, lumbar region: Secondary | ICD-10-CM | POA: Diagnosis not present

## 2018-09-24 DIAGNOSIS — M542 Cervicalgia: Secondary | ICD-10-CM | POA: Diagnosis not present

## 2018-09-29 DIAGNOSIS — M791 Myalgia, unspecified site: Secondary | ICD-10-CM | POA: Diagnosis not present

## 2018-09-29 DIAGNOSIS — M542 Cervicalgia: Secondary | ICD-10-CM | POA: Diagnosis not present

## 2018-09-29 DIAGNOSIS — M545 Low back pain: Secondary | ICD-10-CM | POA: Diagnosis not present

## 2018-09-29 DIAGNOSIS — M5126 Other intervertebral disc displacement, lumbar region: Secondary | ICD-10-CM | POA: Diagnosis not present

## 2018-10-01 DIAGNOSIS — M791 Myalgia, unspecified site: Secondary | ICD-10-CM | POA: Diagnosis not present

## 2018-10-01 DIAGNOSIS — M542 Cervicalgia: Secondary | ICD-10-CM | POA: Diagnosis not present

## 2018-10-01 DIAGNOSIS — M545 Low back pain: Secondary | ICD-10-CM | POA: Diagnosis not present

## 2018-10-01 DIAGNOSIS — M5126 Other intervertebral disc displacement, lumbar region: Secondary | ICD-10-CM | POA: Diagnosis not present

## 2018-10-09 DIAGNOSIS — M5126 Other intervertebral disc displacement, lumbar region: Secondary | ICD-10-CM | POA: Diagnosis not present

## 2018-10-09 DIAGNOSIS — M545 Low back pain: Secondary | ICD-10-CM | POA: Diagnosis not present

## 2018-10-09 DIAGNOSIS — M542 Cervicalgia: Secondary | ICD-10-CM | POA: Diagnosis not present

## 2018-10-09 DIAGNOSIS — M791 Myalgia, unspecified site: Secondary | ICD-10-CM | POA: Diagnosis not present

## 2018-10-13 DIAGNOSIS — M542 Cervicalgia: Secondary | ICD-10-CM | POA: Diagnosis not present

## 2018-10-13 DIAGNOSIS — M5126 Other intervertebral disc displacement, lumbar region: Secondary | ICD-10-CM | POA: Diagnosis not present

## 2018-10-13 DIAGNOSIS — M545 Low back pain: Secondary | ICD-10-CM | POA: Diagnosis not present

## 2018-10-13 DIAGNOSIS — M791 Myalgia, unspecified site: Secondary | ICD-10-CM | POA: Diagnosis not present

## 2018-10-14 DIAGNOSIS — F411 Generalized anxiety disorder: Secondary | ICD-10-CM | POA: Diagnosis not present

## 2018-10-15 DIAGNOSIS — F411 Generalized anxiety disorder: Secondary | ICD-10-CM | POA: Diagnosis not present

## 2018-10-16 DIAGNOSIS — M791 Myalgia, unspecified site: Secondary | ICD-10-CM | POA: Diagnosis not present

## 2018-10-16 DIAGNOSIS — M542 Cervicalgia: Secondary | ICD-10-CM | POA: Diagnosis not present

## 2018-10-16 DIAGNOSIS — M545 Low back pain: Secondary | ICD-10-CM | POA: Diagnosis not present

## 2018-10-16 DIAGNOSIS — M5126 Other intervertebral disc displacement, lumbar region: Secondary | ICD-10-CM | POA: Diagnosis not present

## 2018-10-20 DIAGNOSIS — M545 Low back pain: Secondary | ICD-10-CM | POA: Diagnosis not present

## 2018-10-20 DIAGNOSIS — M791 Myalgia, unspecified site: Secondary | ICD-10-CM | POA: Diagnosis not present

## 2018-10-20 DIAGNOSIS — M542 Cervicalgia: Secondary | ICD-10-CM | POA: Diagnosis not present

## 2018-10-20 DIAGNOSIS — M5126 Other intervertebral disc displacement, lumbar region: Secondary | ICD-10-CM | POA: Diagnosis not present

## 2018-10-23 DIAGNOSIS — M5126 Other intervertebral disc displacement, lumbar region: Secondary | ICD-10-CM | POA: Diagnosis not present

## 2018-10-23 DIAGNOSIS — M545 Low back pain: Secondary | ICD-10-CM | POA: Diagnosis not present

## 2018-10-23 DIAGNOSIS — M542 Cervicalgia: Secondary | ICD-10-CM | POA: Diagnosis not present

## 2018-10-23 DIAGNOSIS — M791 Myalgia, unspecified site: Secondary | ICD-10-CM | POA: Diagnosis not present

## 2018-11-03 DIAGNOSIS — F411 Generalized anxiety disorder: Secondary | ICD-10-CM | POA: Diagnosis not present

## 2018-11-10 ENCOUNTER — Encounter: Payer: Self-pay | Admitting: Gynecology

## 2019-02-19 DIAGNOSIS — Z20828 Contact with and (suspected) exposure to other viral communicable diseases: Secondary | ICD-10-CM | POA: Diagnosis not present

## 2019-04-02 DIAGNOSIS — Z03818 Encounter for observation for suspected exposure to other biological agents ruled out: Secondary | ICD-10-CM | POA: Diagnosis not present

## 2019-04-02 DIAGNOSIS — Z20828 Contact with and (suspected) exposure to other viral communicable diseases: Secondary | ICD-10-CM | POA: Diagnosis not present

## 2019-04-18 ENCOUNTER — Ambulatory Visit: Payer: BC Managed Care – PPO | Attending: Internal Medicine

## 2019-04-18 DIAGNOSIS — Z23 Encounter for immunization: Secondary | ICD-10-CM | POA: Insufficient documentation

## 2019-04-18 NOTE — Progress Notes (Signed)
   Covid-19 Vaccination Clinic  Name:  Angela Hill    MRN: [id number] DOB: June 13, 1985  04/18/2019  [name] was observed post Covid-19 immunization for 15 minutes without incidence. She was provided with Vaccine Information Sheet and instruction to access the V-Safe system.   Ms. Sweat was instructed to call 911 with any severe reactions post vaccine: Marland Kitchen Difficulty breathing  . Swelling of your face and throat  . A fast heartbeat  . A bad rash all over your body  . Dizziness and weakness    Immunizations Administered    Name Date Dose VIS Date Route   Pfizer COVID-19 Vaccine 04/18/2019  1:43 PM 0.3 mL 01/30/2019 Intramuscular   Manufacturer: [place]   Lot: [id number]   [place]: [id number]

## 2019-04-30 ENCOUNTER — Telehealth: Payer: Self-pay | Admitting: *Deleted

## 2019-04-30 MED ORDER — LEVONORGESTREL-ETHINYL ESTRAD 0.15-30 MG-MCG PO TABS: 1.0000 | ORAL_TABLET | Freq: Every day | ORAL | 0 refills | Status: DC

## 2019-04-30 NOTE — Telephone Encounter (Signed)
Patient called requesting a refill on birth control pills, annual exam scheduled on 06/04/19. Rx sent.

## 2019-05-09 ENCOUNTER — Ambulatory Visit: Payer: BC Managed Care – PPO | Attending: Internal Medicine

## 2019-05-09 DIAGNOSIS — Z23 Encounter for immunization: Secondary | ICD-10-CM

## 2019-05-09 NOTE — Progress Notes (Signed)
   Covid-19 Vaccination Clinic  Name:  Zani Cashin    MRN: [id number] DOB: 07-31-1985  05/09/2019  Ms. Eggenberger was observed post Covid-19 immunization for 15 minutes without incident. She was provided with Vaccine Information Sheet and instruction to access the V-Safe system.   Ms. Senter was instructed to call 911 with any severe reactions post vaccine: Marland Kitchen Difficulty breathing  . Swelling of face and throat  . A fast heartbeat  . A bad rash all over body  . Dizziness and weakness   Immunizations Administered    Name Date Dose VIS Date Route   Pfizer COVID-19 Vaccine 05/09/2019  8:17 AM 0.3 mL 01/30/2019 Intramuscular   Manufacturer: [place]   Lot: [id number]   [place]: [id number]

## 2019-06-04 ENCOUNTER — Ambulatory Visit (INDEPENDENT_AMBULATORY_CARE_PROVIDER_SITE_OTHER): Payer: BC Managed Care – PPO | Admitting: Obstetrics and Gynecology

## 2019-06-04 ENCOUNTER — Encounter: Payer: Self-pay | Admitting: Obstetrics and Gynecology

## 2019-06-04 ENCOUNTER — Other Ambulatory Visit: Payer: Self-pay

## 2019-06-04 VITALS — BP 118/74 | Ht 67.0 in | Wt 199.0 lb

## 2019-06-04 DIAGNOSIS — Z01419 Encounter for gynecological examination (general) (routine) without abnormal findings: Secondary | ICD-10-CM | POA: Diagnosis not present

## 2019-06-04 DIAGNOSIS — Z124 Encounter for screening for malignant neoplasm of cervix: Secondary | ICD-10-CM

## 2019-06-04 DIAGNOSIS — Z13220 Encounter for screening for lipoid disorders: Secondary | ICD-10-CM | POA: Diagnosis not present

## 2019-06-04 MED ORDER — LEVONORGESTREL-ETHINYL ESTRAD 0.15-30 MG-MCG PO TABS: 1.0000 | ORAL_TABLET | Freq: Every day | ORAL | 3 refills | Status: DC

## 2019-06-04 NOTE — Addendum Note (Signed)
Addended by: Nelva Nay on: 06/04/2019 11:15 AM   Modules accepted: Orders

## 2019-06-04 NOTE — Progress Notes (Signed)
   Nikima Lisanti Jul 04, 1985 [id number]  SUBJECTIVE:  34 y.o. G0P0 female for annual routine gynecologic exam and Pap smear. She has no gynecologic concerns.  Current Outpatient Medications  Medication Sig Dispense Refill  . levonorgestrel-ethinyl estradiol (LILLOW) 0.15-30 MG-MCG tablet Take 1 tablet by mouth daily. Skip placebo week 4 Package 3  . venlafaxine (EFFEXOR) 75 MG tablet Take 75 mg by mouth daily.      No current facility-administered medications for this visit.   Allergies: Augmentin [amoxicillin-pot clavulanate], Penicillins, and Codeine  Patient's last menstrual period was 05/04/2019.  Past medical history,surgical history, problem list, medications, allergies, family history and social history were all reviewed and documented as reviewed in the EPIC chart.  ROS:  Feeling well. No dyspnea or chest pain on exertion.  No abdominal pain, change in bowel habits, black or bloody stools.  No urinary tract symptoms. GYN ROS: normal menses, no abnormal bleeding, pelvic pain or discharge, no breast pain or new or enlarging lumps on self exam. No neurological complaints.   OBJECTIVE:  BP 118/74   Ht 5\' 7"  (1.702 m)   Wt 199 lb (90.3 kg)   LMP 05/04/2019   BMI 31.17 kg/m  The patient appears well, alert, oriented x 3, in no distress. ENT normal.  Neck supple. No cervical or supraclavicular adenopathy or thyromegaly.  Lungs are clear, good air entry, no wheezes, rhonchi or rales. S1 and S2 normal, no murmurs, regular rate and rhythm.  Abdomen soft without tenderness, guarding, mass or organomegaly.  Neurological is normal, no focal findings.  BREAST EXAM: breasts appear normal, no suspicious masses, no skin or nipple changes or axillary nodes, stable left breast nodularity at 9:00 2 fingerbreadths off the areola.  PELVIC EXAM: VULVA: normal appearing vulva with no masses, tenderness or lesions, VAGINA: normal appearing vagina with normal color and discharge, no lesions,  CERVIX: normal appearing cervix without discharge or lesions, UTERUS: uterus is normal size, shape, consistency and nontender, ADNEXA: normal adnexa in size, nontender and no masses, PAP: Pap smear done today, thin-prep method  Chaperone: Caryn Bee present during the examination  ASSESSMENT:  34 y.o. G0P0 here for annual gynecologic exam  PLAN:   1. No menstrual or hormonal concerns.  2. Pap smear 12/2016.  Repeat Pap smear collected today.  Current screening guidelines calling for the 3-year Pap smear cytology only interval. 3. Contraception.  Doing well on combined hormonal contraceptive pills levonorgestrel-EE 0.15-30 mg-mcg equivalent.  Refill provided.  She prefers continuous administration and this is prescribed in this manner.  Potential for irregular spotting and bleeding increased with using this method. 4. Normal breast exam.  Encouraged breast self awareness and notify us of any concerning changes.  Stable left breast nodularity as described above and noted on previous examinations.  Baseline mammography to start at age 40. 110. Health maintenance.  Routine screening blood work (lipids, CBC, CMP) is ordered and she will present to lab.  Return annually or sooner, prn.  Joseph Pierini MD, FACOG  06/04/19

## 2019-06-05 LAB — CBC
HCT: 40.1 % (ref 35.0–45.0)
Hemoglobin: 13.2 g/dL (ref 11.7–15.5)
MCH: 28.7 pg (ref 27.0–33.0)
MCHC: 32.9 g/dL (ref 32.0–36.0)
MCV: 87.2 fL (ref 80.0–100.0)
MPV: 11.4 fL (ref 7.5–12.5)
Platelets: 265 10*3/uL (ref 140–400)
RBC: 4.6 10*6/uL (ref 3.80–5.10)
RDW: 12.5 % (ref 11.0–15.0)
WBC: 7.7 10*3/uL (ref 3.8–10.8)

## 2019-06-05 LAB — COMPREHENSIVE METABOLIC PANEL
AG Ratio: 1.5 (calc) (ref 1.0–2.5)
ALT: 12 U/L (ref 6–29)
AST: 20 U/L (ref 10–30)
Albumin: 4.1 g/dL (ref 3.6–5.1)
Alkaline phosphatase (APISO): 47 U/L (ref 31–125)
BUN: 14 mg/dL (ref 7–25)
CO2: 26 mmol/L (ref 20–32)
Calcium: 9.4 mg/dL (ref 8.6–10.2)
Chloride: 104 mmol/L (ref 98–110)
Creat: 0.68 mg/dL (ref 0.50–1.10)
Globulin: 2.8 g/dL (calc) (ref 1.9–3.7)
Glucose, Bld: 82 mg/dL (ref 65–99)
Potassium: 4.2 mmol/L (ref 3.5–5.3)
Sodium: 139 mmol/L (ref 135–146)
Total Bilirubin: 0.4 mg/dL (ref 0.2–1.2)
Total Protein: 6.9 g/dL (ref 6.1–8.1)

## 2019-06-05 LAB — LIPID PANEL
Cholesterol: 190 mg/dL (ref <200)
HDL: 70 mg/dL (ref >=50)
LDL Cholesterol (Calc): 101 mg/dL (calc) — ABNORMAL HIGH
Non-HDL Cholesterol (Calc): 120 mg/dL (calc) (ref <130)
Total CHOL/HDL Ratio: 2.7 (calc) (ref <5.0)
Triglycerides: 93 mg/dL (ref <150)

## 2019-06-08 LAB — PAP IG W/ RFLX HPV ASCU

## 2019-06-09 DIAGNOSIS — R635 Abnormal weight gain: Secondary | ICD-10-CM | POA: Diagnosis not present

## 2019-06-09 DIAGNOSIS — F418 Other specified anxiety disorders: Secondary | ICD-10-CM | POA: Diagnosis not present

## 2019-06-29 DIAGNOSIS — F411 Generalized anxiety disorder: Secondary | ICD-10-CM | POA: Diagnosis not present

## 2019-07-09 DIAGNOSIS — F411 Generalized anxiety disorder: Secondary | ICD-10-CM | POA: Diagnosis not present

## 2019-07-16 DIAGNOSIS — F411 Generalized anxiety disorder: Secondary | ICD-10-CM | POA: Diagnosis not present

## 2019-07-30 DIAGNOSIS — F411 Generalized anxiety disorder: Secondary | ICD-10-CM | POA: Diagnosis not present

## 2019-08-04 ENCOUNTER — Other Ambulatory Visit: Payer: Self-pay | Admitting: Obstetrics and Gynecology

## 2019-10-21 DIAGNOSIS — J3089 Other allergic rhinitis: Secondary | ICD-10-CM | POA: Diagnosis not present

## 2019-10-21 DIAGNOSIS — T781XXD Other adverse food reactions, not elsewhere classified, subsequent encounter: Secondary | ICD-10-CM | POA: Diagnosis not present

## 2019-10-21 DIAGNOSIS — J4530 Mild persistent asthma, uncomplicated: Secondary | ICD-10-CM | POA: Diagnosis not present

## 2019-10-21 DIAGNOSIS — J301 Allergic rhinitis due to pollen: Secondary | ICD-10-CM | POA: Diagnosis not present

## 2019-10-28 ENCOUNTER — Encounter: Payer: Self-pay | Admitting: Internal Medicine

## 2019-10-29 ENCOUNTER — Other Ambulatory Visit: Payer: Self-pay

## 2019-10-29 MED ORDER — LEVONORGESTREL-ETHINYL ESTRAD 0.15-30 MG-MCG PO TABS: ORAL_TABLET | ORAL | 2 refills | Status: DC

## 2019-10-29 NOTE — Telephone Encounter (Signed)
Change to mail order pharmacy. Needs 90d Rx.

## 2019-11-02 DIAGNOSIS — T781XXA Other adverse food reactions, not elsewhere classified, initial encounter: Secondary | ICD-10-CM | POA: Diagnosis not present

## 2019-11-02 DIAGNOSIS — J3089 Other allergic rhinitis: Secondary | ICD-10-CM | POA: Diagnosis not present

## 2019-12-31 ENCOUNTER — Other Ambulatory Visit (INDEPENDENT_AMBULATORY_CARE_PROVIDER_SITE_OTHER): Payer: BC Managed Care – PPO

## 2019-12-31 ENCOUNTER — Encounter: Payer: Self-pay | Admitting: Internal Medicine

## 2019-12-31 ENCOUNTER — Ambulatory Visit (INDEPENDENT_AMBULATORY_CARE_PROVIDER_SITE_OTHER): Payer: BC Managed Care – PPO | Admitting: Internal Medicine

## 2019-12-31 VITALS — BP 110/70 | HR 104 | Ht 67.0 in | Wt 207.0 lb

## 2019-12-31 DIAGNOSIS — K219 Gastro-esophageal reflux disease without esophagitis: Secondary | ICD-10-CM | POA: Diagnosis not present

## 2019-12-31 DIAGNOSIS — R197 Diarrhea, unspecified: Secondary | ICD-10-CM

## 2019-12-31 DIAGNOSIS — R140 Abdominal distension (gaseous): Secondary | ICD-10-CM

## 2019-12-31 DIAGNOSIS — R635 Abnormal weight gain: Secondary | ICD-10-CM | POA: Diagnosis not present

## 2019-12-31 DIAGNOSIS — R110 Nausea: Secondary | ICD-10-CM

## 2019-12-31 LAB — TSH: TSH: 1.01 u[IU]/mL (ref 0.35–4.50)

## 2019-12-31 LAB — CBC WITH DIFFERENTIAL/PLATELET
Basophils Absolute: 0.1 10*3/uL (ref 0.0–0.1)
Basophils Relative: 0.6 % (ref 0.0–3.0)
Eosinophils Absolute: 0.3 10*3/uL (ref 0.0–0.7)
Eosinophils Relative: 4.2 % (ref 0.0–5.0)
HCT: 39.6 % (ref 36.0–46.0)
Hemoglobin: 13.3 g/dL (ref 12.0–15.0)
Lymphocytes Relative: 26.9 % (ref 12.0–46.0)
Lymphs Abs: 2.1 10*3/uL (ref 0.7–4.0)
MCHC: 33.6 g/dL (ref 30.0–36.0)
MCV: 85.1 fl (ref 78.0–100.0)
Monocytes Absolute: 0.4 10*3/uL (ref 0.1–1.0)
Monocytes Relative: 5 % (ref 3.0–12.0)
Neutro Abs: 4.9 10*3/uL (ref 1.4–7.7)
Neutrophils Relative %: 63.3 % (ref 43.0–77.0)
Platelets: 264 10*3/uL (ref 150.0–400.0)
RBC: 4.66 Mil/uL (ref 3.87–5.11)
RDW: 12.8 % (ref 11.5–15.5)
WBC: 7.8 10*3/uL (ref 4.0–10.5)

## 2019-12-31 LAB — COMPREHENSIVE METABOLIC PANEL
ALT: 16 U/L (ref 0–35)
AST: 18 U/L (ref 0–37)
Albumin: 4.2 g/dL (ref 3.5–5.2)
Alkaline Phosphatase: 56 U/L (ref 39–117)
BUN: 12 mg/dL (ref 6–23)
CO2: 28 mEq/L (ref 19–32)
Calcium: 9.1 mg/dL (ref 8.4–10.5)
Chloride: 101 mEq/L (ref 96–112)
Creatinine, Ser: 0.65 mg/dL (ref 0.40–1.20)
GFR: 114.1 mL/min (ref >60.00)
Glucose, Bld: 83 mg/dL (ref 70–99)
Potassium: 3.8 mEq/L (ref 3.5–5.1)
Sodium: 136 mEq/L (ref 135–145)
Total Bilirubin: 0.7 mg/dL (ref 0.2–1.2)
Total Protein: 7.3 g/dL (ref 6.0–8.3)

## 2019-12-31 LAB — CORTISOL: Cortisol, Plasma: 6.8 ug/dL

## 2019-12-31 LAB — HIGH SENSITIVITY CRP: CRP, High Sensitivity: 14.02 mg/L — ABNORMAL HIGH (ref 0.000–5.000)

## 2019-12-31 LAB — SEDIMENTATION RATE: Sed Rate: 25 mm/hr — ABNORMAL HIGH (ref 0–20)

## 2019-12-31 MED ORDER — SUTAB 1479-225-188 MG PO TABS: 1.0000 | ORAL_TABLET | ORAL | 0 refills | Status: DC

## 2019-12-31 NOTE — Patient Instructions (Signed)
You have been scheduled for an endoscopy and colonoscopy. Please follow the written instructions given to you at your visit today. Please pick up your prep supplies at the pharmacy within the next 1-3 days. If you use inhalers (even only as needed), please bring them with you on the day of your procedure.  You have been scheduled for an abdominal ultrasound at [facility] (1st floor of hospital) on Thursday, 01/07/20 at 8:30 am. Please arrive 15 minutes prior to your appointment for registration. Make certain not to have anything to eat or drink 6 hours prior to your appointment. Should you need to reschedule your appointment, please contact radiology at [phone]. This test typically takes about 30 minutes to perform.  Your provider has requested that you go to the basement level for lab work before leaving today. Press "B" on the elevator. The lab is located at the first door on the left as you exit the elevator.  We have requested records from [facility].  If you are age 26 or younger, your body mass index should be between 19-25. Your Body mass index is 32.42 kg/m. If this is out of the aformentioned range listed, please consider follow up with your Primary Care Provider.   Due to recent changes in healthcare laws, you may see the results of your imaging and laboratory studies on MyChart before your provider has had a chance to review them.  We understand that in some cases there may be results that are confusing or concerning to you. Not all laboratory results come back in the same time frame and the provider may be waiting for multiple results in order to interpret others.  Please give [place] 48 hours in order for your provider to thoroughly review all the results before contacting the office for clarification of your results.

## 2019-12-31 NOTE — Progress Notes (Signed)
Patient ID: Angela Hill, female   DOB: Apr 05, 1985, 34 y.o.   MRN: [id number] HPI: Angela Hill is a 34 year old female with a past medical history of GERD, possible irritable bowel syndrome, anxiety and depression who is seen in consult at the request of Dr. Orland Mustard to evaluate reflux, nausea, diarrhea, gas and bloating.  She is here alone today.  She reports that she has had longstanding issues with GERD but this has been worsening over the last 6 to 12 months.  Worse with eating.  She can feel severe pyrosis or "fire" in her substernal chest which can last 6 to 12 hours.  Severe episodes occur once every 1 to 2 weeks.  Occur more frequently but to a minor degree.  Not trying any over-the-counter medications.  Trying to eat bland foods.  Does not have dysphagia or odynophagia.  She has nausea on a regular basis without regular vomiting.  She has vomiting maybe once per month.  Denies self-induced vomiting.  She has had changes in her appetite.  She does have frequent abdominal bloating.  Bowel movements are inconsistent but loose on a daily basis.  They are often urgent.  She can see blood with wiping.  She does use Imodium at times.  No blood in stool or melena.  She does report that she has been told that she may have a gluten allergy and she tries to avoid gluten but not strictly.  She last had gluten-containing food yesterday.  She endorses high stress in her life.  Her mother died recently of cirrhosis felt secondary to alcohol and possibly contributed by prior gastric bypass.  Her maternal grandmother and grandfather also had cirrhosis related to alcohol.  Her dad is currently hospitalized in [place] and he has dementia.  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  She drinks alcohol very rarely may be 1 or 2 drinks per month.  Never heavy alcohol use.  She has been seen by [facility] and was told that she had elevated cortisol levels.  She is  concerned about weight gain having gained about 30 pounds 2 years ago when she was first treated with Zoloft for anxiety and depression.  She has gained 10 to 15 pounds in the last year which she attributes somewhat to stress but does not feel that she is having higher calorie intake.  She has had prior GI evaluation with upper endoscopy around 2007 in [place]. She has been told that her gallbladder function is borderline but she has never had gallbladder surgery.  She is also been told that B12 was low in the past and she has been less regular with oral supplementation of late. She does have mild allergy and is seeing Dr. Harold Hedge.  She has also multiple environmental allergies.  She is not on immunotherapy for allergies.  Past Medical History:  Diagnosis Date  . Anxiety   . Chronic recurrent sinusitis 9/15 - present  . Depression   . Yeast vaginitis    Recurring    Past Surgical History:  Procedure Laterality Date  . BREAST BIOPSY    . Insert Mirena  4/12   removed 2015  . UPPER GASTROINTESTINAL ENDOSCOPY     2006. States she had increased emesis after eating. Unremarkable    Outpatient Medications Prior to Visit  Medication Sig Dispense Refill  . fluticasone (FLONASE) 50 MCG/ACT nasal spray Place 1 spray into both nostrils as needed.    Marland Kitchen levocetirizine (XYZAL) 5 MG  tablet Take 5 mg by mouth daily.    Marland Kitchen levonorgestrel-ethinyl estradiol (ALTAVERA) 0.15-30 MG-MCG tablet Take one active pill daily. Skip placebo week. 84 tablet 2  . SYMBICORT 80-4.5 MCG/ACT inhaler Inhale 1 puff into the lungs as needed.    . venlafaxine (EFFEXOR) 75 MG tablet Take 75 mg by mouth daily.     Marland Kitchen venlafaxine XR (EFFEXOR-XR) 37.5 MG 24 hr capsule Take 37.5 mg by mouth daily.     No facility-administered medications prior to visit.    Allergies  Allergen Reactions  . Augmentin [Amoxicillin-Pot Clavulanate] Swelling  . Penicillins Swelling  . Codeine Nausea And Vomiting    Family History   Problem Relation Age of Onset  . Hypertension Mother   . Cirrhosis Mother   . Depression Mother   . Anxiety disorder Mother   . Dementia Father   . AAA (abdominal aortic aneurysm) Father   . Healthy Sister   . GI Disease Brother        GERD  . Healthy Brother   . Other Maternal Grandmother        unknown  . Other Maternal Grandfather        unknown  . Other Paternal Grandmother        unknown  . Other Paternal Grandfather        unknown  . Colon cancer Neg Hx   . Stomach cancer Neg Hx   . Esophageal cancer Neg Hx   . Pancreatic cancer Neg Hx     Social History   Tobacco Use  . Smoking status: Never Smoker  . Smokeless tobacco: Never Used  Vaping Use  . Vaping Use: Never used  Substance Use Topics  . Alcohol use: Yes    Comment: Social  . Drug use: No    ROS: As per history of present illness, otherwise negative  BP 110/70   Pulse (!) 104   Ht 5' 7"  (1.702 m)   Wt 207 lb (93.9 kg)   LMP 12/24/2019 (Exact Date)   SpO2 97%   BMI 32.42 kg/m  Constitutional: Well-developed and well-nourished. No distress. HEENT: Normocephalic and atraumatic.  No scleral icterus. Neck: Neck supple. Trachea midline. Cardiovascular: Normal rate, regular rhythm and intact distal pulses. No M/R/G Pulmonary/chest: Effort normal and breath sounds normal. No wheezing, rales or rhonchi. Abdominal: Soft, diffusely tender without rebound or guarding, nondistended. Bowel sounds active throughout. There are no masses palpable. No hepatosplenomegaly. Extremities: no clubbing, cyanosis, or edema Neurological: Alert and oriented to person place and time. Skin: Skin is warm and dry.  Psychiatric: Normal mood and affect. Behavior is normal.  RELEVANT LABS AND IMAGING: CBC    Component Value Date/Time   WBC 7.8 12/31/2019 1007   RBC 4.66 12/31/2019 1007   HGB 13.3 12/31/2019 1007   HCT 39.6 12/31/2019 1007   PLT 264.0 12/31/2019 1007   MCV 85.1 12/31/2019 1007   MCH 28.7 06/04/2019  1032   MCHC 33.6 12/31/2019 1007   RDW 12.8 12/31/2019 1007   LYMPHSABS 2.1 12/31/2019 1007   MONOABS 0.4 12/31/2019 1007   EOSABS 0.3 12/31/2019 1007   BASOSABS 0.1 12/31/2019 1007    CMP     Component Value Date/Time   NA 136 12/31/2019 1007   K 3.8 12/31/2019 1007   CL 101 12/31/2019 1007   CO2 28 12/31/2019 1007   GLUCOSE 83 12/31/2019 1007   BUN 12 12/31/2019 1007   CREATININE 0.65 12/31/2019 1007   CREATININE 0.68 06/04/2019 1032  CALCIUM 9.1 12/31/2019 1007   PROT 7.3 12/31/2019 1007   ALBUMIN 4.2 12/31/2019 1007   AST 18 12/31/2019 1007   ALT 16 12/31/2019 1007   ALKPHOS 56 12/31/2019 1007   BILITOT 0.7 12/31/2019 1007    ASSESSMENT/PLAN: 34 year old female with a past medical history of GERD, possible irritable bowel syndrome, anxiety and depression who is seen in consult at the request of Dr. Orland Mustard to evaluate reflux, nausea, diarrhea, gas and bloating.  1. GERD/nausea --definitive GERD symptoms.  Begin pantoprazole 40 mg daily.  Upper endoscopy recommended.  We discussed the risk, benefits and alternatives and she is agreeable and wishes to proceed  2.  Intermittent diarrhea/gas and bloating/minor rectal bleeding --differential is broad here and includes celiac disease, IBD, SIBO, IBS.  I recommended lab work as well as colonoscopy.  We discussed the risk, benefits and alternatives and she is agreeable and wishes to proceed.  We will also perform an abdominal ultrasound.  Evaluate gallbladder. --Celiac panel, CBC, CMP, CRP, ESR, TSH, B12 --If colonoscopy and lab evaluation negative consider rifaximin which would also treat CIWA --Check fecal calprotectin and fecal elastase, ova and parasite  3.  Possible B12 deficiency --check serum B12 today  4.  Weight gain --she has been told her cortisol was elevated, check a.m. cortisol today.  Check TSH.   UQ:HNSHPH, Marjory Lies, [place],  [place] [address]

## 2020-01-01 ENCOUNTER — Other Ambulatory Visit: Payer: Self-pay | Admitting: Internal Medicine

## 2020-01-01 DIAGNOSIS — K219 Gastro-esophageal reflux disease without esophagitis: Secondary | ICD-10-CM

## 2020-01-01 DIAGNOSIS — R140 Abdominal distension (gaseous): Secondary | ICD-10-CM

## 2020-01-01 DIAGNOSIS — R197 Diarrhea, unspecified: Secondary | ICD-10-CM

## 2020-01-01 DIAGNOSIS — R110 Nausea: Secondary | ICD-10-CM

## 2020-01-01 DIAGNOSIS — R635 Abnormal weight gain: Secondary | ICD-10-CM

## 2020-01-01 LAB — TISSUE TRANSGLUTAMINASE, IGA: (tTG) Ab, IgA: <1 U/mL

## 2020-01-01 LAB — IGA: Immunoglobulin A: 319 mg/dL — ABNORMAL HIGH (ref 47–310)

## 2020-01-07 ENCOUNTER — Ambulatory Visit (HOSPITAL_COMMUNITY): Payer: BC Managed Care – PPO

## 2020-01-07 ENCOUNTER — Other Ambulatory Visit: Payer: BC Managed Care – PPO

## 2020-01-07 DIAGNOSIS — R635 Abnormal weight gain: Secondary | ICD-10-CM

## 2020-01-07 DIAGNOSIS — R140 Abdominal distension (gaseous): Secondary | ICD-10-CM

## 2020-01-07 DIAGNOSIS — R197 Diarrhea, unspecified: Secondary | ICD-10-CM | POA: Diagnosis not present

## 2020-01-07 DIAGNOSIS — R110 Nausea: Secondary | ICD-10-CM

## 2020-01-12 ENCOUNTER — Ambulatory Visit (HOSPITAL_COMMUNITY)
Admission: RE | Admit: 2020-01-12 | Discharge: 2020-01-12 | Disposition: A | Discharge status: Home / Self Care | Payer: BC Managed Care – PPO | Source: Ambulatory Visit | Attending: Internal Medicine | Admitting: Internal Medicine

## 2020-01-12 ENCOUNTER — Other Ambulatory Visit: Payer: Self-pay

## 2020-01-12 DIAGNOSIS — K219 Gastro-esophageal reflux disease without esophagitis: Secondary | ICD-10-CM | POA: Diagnosis not present

## 2020-01-12 DIAGNOSIS — R635 Abnormal weight gain: Secondary | ICD-10-CM | POA: Diagnosis not present

## 2020-01-12 DIAGNOSIS — R197 Diarrhea, unspecified: Secondary | ICD-10-CM | POA: Diagnosis not present

## 2020-01-12 DIAGNOSIS — R140 Abdominal distension (gaseous): Secondary | ICD-10-CM | POA: Diagnosis not present

## 2020-01-12 DIAGNOSIS — R110 Nausea: Secondary | ICD-10-CM | POA: Insufficient documentation

## 2020-01-13 ENCOUNTER — Other Ambulatory Visit: Payer: Self-pay | Admitting: Internal Medicine

## 2020-01-13 LAB — OVA AND PARASITE EXAMINATION
CONCENTRATE RESULT:: NONE SEEN
MICRO NUMBER:: 11220995
SPECIMEN QUALITY:: ADEQUATE
TRICHROME RESULT:: NONE SEEN

## 2020-01-13 LAB — CALPROTECTIN: Calprotectin: 14 mcg/g

## 2020-01-13 LAB — PANCREATIC ELASTASE, FECAL: Pancreatic Elastase-1, Stool: >500 mcg/g

## 2020-03-03 ENCOUNTER — Encounter: Payer: BC Managed Care – PPO | Admitting: Internal Medicine

## 2020-03-11 DIAGNOSIS — Z0000 Encounter for general adult medical examination without abnormal findings: Secondary | ICD-10-CM | POA: Diagnosis not present

## 2020-03-11 DIAGNOSIS — Z13220 Encounter for screening for lipoid disorders: Secondary | ICD-10-CM | POA: Diagnosis not present

## 2020-03-11 DIAGNOSIS — R7309 Other abnormal glucose: Secondary | ICD-10-CM | POA: Diagnosis not present

## 2020-03-20 ENCOUNTER — Encounter (INDEPENDENT_AMBULATORY_CARE_PROVIDER_SITE_OTHER): Payer: Self-pay

## 2020-04-05 ENCOUNTER — Other Ambulatory Visit: Payer: Self-pay

## 2020-04-05 ENCOUNTER — Telehealth: Payer: Self-pay

## 2020-04-05 ENCOUNTER — Ambulatory Visit: Payer: BC Managed Care – PPO

## 2020-04-05 NOTE — Telephone Encounter (Signed)
Pt just called, PV was r/s to tomorrow 04/06/20 at 3:30pm rm. 51.

## 2020-04-05 NOTE — Telephone Encounter (Signed)
Multiple attempts made to contact patient and messages left for patient to complete pre visit appt; LMTCB prior to 5pm to reschedule pre visit appt and procedure date; no show letter will be sent if patient has not called prior to EOB day.

## 2020-04-06 ENCOUNTER — Other Ambulatory Visit: Payer: Self-pay

## 2020-04-06 ENCOUNTER — Ambulatory Visit (AMBULATORY_SURGERY_CENTER): Payer: BC Managed Care – PPO | Admitting: *Deleted

## 2020-04-06 VITALS — Ht 67.0 in | Wt 200.0 lb

## 2020-04-06 DIAGNOSIS — R140 Abdominal distension (gaseous): Secondary | ICD-10-CM

## 2020-04-06 DIAGNOSIS — R635 Abnormal weight gain: Secondary | ICD-10-CM

## 2020-04-06 DIAGNOSIS — R197 Diarrhea, unspecified: Secondary | ICD-10-CM

## 2020-04-06 DIAGNOSIS — R110 Nausea: Secondary | ICD-10-CM

## 2020-04-06 DIAGNOSIS — K219 Gastro-esophageal reflux disease without esophagitis: Secondary | ICD-10-CM

## 2020-04-06 NOTE — Progress Notes (Signed)
Completed covid vaccines  Pt is aware that care partner will wait in the car during procedure; if they feel like they will be too hot or cold to wait in the car; they may wait in the 4 th floor lobby. Patient is aware to bring only one care partner. We want them to wear a mask (we do not have any that we can provide them), practice social distancing, and we will check their temperatures when they get here.  I did remind the patient that their care partner needs to stay in the parking lot the entire time and have a cell phone available, we will call them when the pt is ready for discharge. Patient will wear mask into building.  Pt's previsit is done over the phone and all paperwork (prep instructions, blank consent form to just read over, pre-procedure acknowledgement form and stamped envelope) sent to patient   No trouble with anesthesia, denies trouble moving neck, or hx/fam hx of malignant hyperthermia per pt  Pt has Sutabs at home   No egg or soy allergy  No home oxygen use   No medications for weight loss taken  Pt denies constipation issues  Pt informed that we do not do prior authorizations for prep

## 2020-04-14 ENCOUNTER — Encounter: Payer: Self-pay | Admitting: Internal Medicine

## 2020-04-19 ENCOUNTER — Encounter: Payer: Self-pay | Admitting: Internal Medicine

## 2020-04-19 ENCOUNTER — Other Ambulatory Visit: Payer: Self-pay

## 2020-04-19 ENCOUNTER — Ambulatory Visit (AMBULATORY_SURGERY_CENTER): Payer: BC Managed Care – PPO | Admitting: Internal Medicine

## 2020-04-19 VITALS — BP 132/82 | HR 74 | Temp 97.7°F | Resp 17 | Ht 67.0 in | Wt 200.0 lb

## 2020-04-19 DIAGNOSIS — R197 Diarrhea, unspecified: Secondary | ICD-10-CM

## 2020-04-19 DIAGNOSIS — D120 Benign neoplasm of cecum: Secondary | ICD-10-CM

## 2020-04-19 DIAGNOSIS — D124 Benign neoplasm of descending colon: Secondary | ICD-10-CM

## 2020-04-19 DIAGNOSIS — K625 Hemorrhage of anus and rectum: Secondary | ICD-10-CM | POA: Diagnosis not present

## 2020-04-19 DIAGNOSIS — K2950 Unspecified chronic gastritis without bleeding: Secondary | ICD-10-CM | POA: Diagnosis not present

## 2020-04-19 DIAGNOSIS — K2970 Gastritis, unspecified, without bleeding: Secondary | ICD-10-CM | POA: Diagnosis not present

## 2020-04-19 DIAGNOSIS — K635 Polyp of colon: Secondary | ICD-10-CM | POA: Diagnosis not present

## 2020-04-19 DIAGNOSIS — K648 Other hemorrhoids: Secondary | ICD-10-CM | POA: Diagnosis not present

## 2020-04-19 DIAGNOSIS — K2980 Duodenitis without bleeding: Secondary | ICD-10-CM | POA: Diagnosis not present

## 2020-04-19 DIAGNOSIS — R110 Nausea: Secondary | ICD-10-CM

## 2020-04-19 DIAGNOSIS — K219 Gastro-esophageal reflux disease without esophagitis: Secondary | ICD-10-CM

## 2020-04-19 DIAGNOSIS — Z1211 Encounter for screening for malignant neoplasm of colon: Secondary | ICD-10-CM | POA: Diagnosis not present

## 2020-04-19 MED ORDER — SODIUM CHLORIDE 0.9 % IV SOLN: 500.0000 mL | Freq: Once | INTRAVENOUS | Status: DC

## 2020-04-19 NOTE — Op Note (Signed)
[place] Patient Name: Angela Hill Procedure Date: 04/19/2020 11:12 AM MRN: [id number] Endoscopist: Jerene Bears , MD Age: 35 Referring MD:  Date of Birth: [birth year] Gender: Female Account #: [id number] Procedure:                Upper GI endoscopy Indications:              Gastro-esophageal reflux disease, Nausea, loose                            stools, abdominal bloating, elevated ESR and CRP Medicines:                Monitored Anesthesia Care Procedure:                Pre-Anesthesia Assessment:                           - Prior to the procedure, a History and Physical                            was performed, and patient medications and                            allergies were reviewed. The patient's tolerance of                            previous anesthesia was also reviewed. The risks                            and benefits of the procedure and the sedation                            options and risks were discussed with the patient.                            All questions were answered, and informed consent                            was obtained. Prior Anticoagulants: The patient has                            taken no previous anticoagulant or antiplatelet                            agents. ASA Grade Assessment: II - A patient with                            mild systemic disease. After reviewing the risks                            and benefits, the patient was deemed in                            satisfactory condition to undergo the procedure.  After obtaining informed consent, the endoscope was                            passed under direct vision. Throughout the                            procedure, the patient's blood pressure, pulse, and                            oxygen saturations were monitored continuously. The                            Endoscope was introduced through the mouth, and                            advanced  to the second part of duodenum. The upper                            GI endoscopy was accomplished without difficulty.                            The patient tolerated the procedure well. Scope In: Scope Out: Findings:                 The examined esophagus was normal. Z-line is                            regular at 40 cm. Biopsies were taken with a cold                            forceps from the lower and mid esophagus for                            histology to evaluate for reflux inflammation                            despite once daily PPI.                           Mild inflammation characterized by erythema was                            found in the gastric antrum. Biopsies were taken                            with a cold forceps for histology and Helicobacter                            pylori testing. Stomach otherwise normal.                           A few localized erosions with erythema were found  in the second portion of the duodenum. This area                            was well last the duodenal bulb and 2-3 cm segment.                            Biopsies were taken with a cold forceps for                            histology.                           The exam of the duodenum was otherwise normal. Estimated Blood Loss:     Estimated blood loss: none. Impression:               - Normal esophagus. Biopsied.                           - Gastritis. Biopsied.                           - Duodenal erosions in 2nd portion without                            bleeding. Biopsied. Recommendation:           - Patient has a contact number available for                            emergencies. The signs and symptoms of potential                            delayed complications were discussed with the                            patient. Return to normal activities tomorrow.                            Written discharge instructions were provided to the                             patient.                           - Resume previous diet.                           - Continue present medications.                           - Await pathology results.                           - See the other procedure note for documentation of                            additional recommendations.  Jerene Bears, MD 04/19/2020 11:57:02 AM This report has been signed electronically.

## 2020-04-19 NOTE — Progress Notes (Signed)
Called to room to assist during endoscopic procedure.  Patient ID and intended procedure confirmed with present staff. Received instructions for my participation in the procedure from the performing physician.  

## 2020-04-19 NOTE — Progress Notes (Signed)
Report given to PACU, vss 

## 2020-04-19 NOTE — Progress Notes (Signed)
1118 Robinul 0.1 mg IV given due large amount of secretions upon assessment.  MD made aware, vss 

## 2020-04-19 NOTE — Patient Instructions (Signed)
Handouts on polyps, hemorrhoids, and gastritis given to you today  Await pathology results    YOU HAD AN ENDOSCOPIC PROCEDURE TODAY AT [facility]:   Refer to the procedure report that was given to you for any specific questions about what was found during the examination.  If the procedure report does not answer your questions, please call your gastroenterologist to clarify.  If you requested that your care partner not be given the details of your procedure findings, then the procedure report has been included in a sealed envelope for you to review at your convenience later.  YOU SHOULD EXPECT: Some feelings of bloating in the abdomen. Passage of more gas than usual.  Walking can help get rid of the air that was put into your GI tract during the procedure and reduce the bloating. If you had a lower endoscopy (such as a colonoscopy or flexible sigmoidoscopy) you may notice spotting of blood in your stool or on the toilet paper. If you underwent a bowel prep for your procedure, you may not have a normal bowel movement for a few days.  Please Note:  You might notice some irritation and congestion in your nose or some drainage.  This is from the oxygen used during your procedure.  There is no need for concern and it should clear up in a day or so.  SYMPTOMS TO REPORT IMMEDIATELY:   Following lower endoscopy (colonoscopy or flexible sigmoidoscopy):  Excessive amounts of blood in the stool  Significant tenderness or worsening of abdominal pains  Swelling of the abdomen that is new, acute  Fever of 100F or higher   Following upper endoscopy (EGD)  Vomiting of blood or coffee ground material  New chest pain or pain under the shoulder blades  Painful or persistently difficult swallowing  New shortness of breath  Fever of 100F or higher  Black, tarry-looking stools  For urgent or emergent issues, a gastroenterologist can be reached at any hour by calling [phone]. Do not  use MyChart messaging for urgent concerns.    DIET:  We do recommend a small meal at first, but then you may proceed to your regular diet.  Drink plenty of fluids but you should avoid alcoholic beverages for 24 hours.  ACTIVITY:  You should plan to take it easy for the rest of today and you should NOT DRIVE or use heavy machinery until tomorrow (because of the sedation medicines used during the test).    FOLLOW UP: Our staff will call the number listed on your records 48-72 hours following your procedure to check on you and address any questions or concerns that you may have regarding the information given to you following your procedure. If we do not reach you, we will leave a message.  We will attempt to reach you two times.  During this call, we will ask if you have developed any symptoms of COVID 19. If you develop any symptoms (ie: fever, flu-like symptoms, shortness of breath, cough etc.) before then, please call [phone].  If you test positive for Covid 19 in the 2 weeks post procedure, please call and report this information to [place].    If any biopsies were taken you will be contacted by phone or by letter within the next 1-3 weeks.  Please call us at [phone] if you have not heard about the biopsies in 3 weeks.    SIGNATURES/CONFIDENTIALITY: You and/or your care partner have signed paperwork which will be entered into  your electronic medical record.  These signatures attest to the fact that that the information above on your After Visit Summary has been reviewed and is understood.  Full responsibility of the confidentiality of this discharge information lies with you and/or your care-partner.

## 2020-04-19 NOTE — Op Note (Addendum)
[place] Patient Name: Angela Hill Procedure Date: 04/19/2020 11:12 AM MRN: [id number] Endoscopist: Jerene Bears , MD Age: 35 Referring MD:  Date of Birth: April 06, 1985 Gender: Female Account #: [id number] Procedure:                Colonoscopy Indications:              loose stools, intermittent rectal bleeding,                            abdominal bloating, elevated ESR and CRP Medicines:                Monitored Anesthesia Care Procedure:                Pre-Anesthesia Assessment:                           - Prior to the procedure, a History and Physical                            was performed, and patient medications and                            allergies were reviewed. The patient's tolerance of                            previous anesthesia was also reviewed. The risks                            and benefits of the procedure and the sedation                            options and risks were discussed with the patient.                            All questions were answered, and informed consent                            was obtained. Prior Anticoagulants: The patient has                            taken no previous anticoagulant or antiplatelet                            agents. ASA Grade Assessment: II - A patient with                            mild systemic disease. After reviewing the risks                            and benefits, the patient was deemed in                            satisfactory condition to undergo the procedure.  After obtaining informed consent, the colonoscope                            was passed under direct vision. Throughout the                            procedure, the patient's blood pressure, pulse, and                            oxygen saturations were monitored continuously. The                            Olympus [id number]) [id number] was introduced                            through the anus and  advanced to the terminal                            ileum. The colonoscopy was performed without                            difficulty. The patient tolerated the procedure                            well. The quality of the bowel preparation was                            excellent. The terminal ileum, ileocecal valve,                            appendiceal orifice, and rectum were photographed. Scope In: 11:36:15 AM Scope Out: 07:62:26 AM Scope Withdrawal Time: 0 hours 11 minutes 54 seconds  Total Procedure Duration: 0 hours 13 minutes 31 seconds  Findings:                 The digital rectal exam was normal.                           The terminal ileum appeared normal.                           A 5 mm polyp was found in the cecum. The polyp was                            flat. The polyp was removed with a cold snare.                            Resection and retrieval were complete.                           A 4 mm polyp was found in the descending colon. The                            polyp was sessile. The polyp  was removed with a                            cold snare. Resection and retrieval were complete.                           Internal hemorrhoids were found during                            retroflexion. The hemorrhoids were small.                           The exam was otherwise normal throughout the                            examined colon. No evidence of colitis. Complications:            No immediate complications. Estimated Blood Loss:     Estimated blood loss was minimal. Impression:               - The examined portion of the ileum was normal.                           - One 5 mm polyp in the cecum, removed with a cold                            snare. Resected and retrieved.                           - One 4 mm polyp in the descending colon, removed                            with a cold snare. Resected and retrieved.                           - Small internal  hemorrhoids. Recommendation:           - Patient has a contact number available for                            emergencies. The signs and symptoms of potential                            delayed complications were discussed with the                            patient. Return to normal activities tomorrow.                            Written discharge instructions were provided to the                            patient.                           - Resume previous diet.                           -  Continue present medications.                           - Await pathology results.                           - Further recommendations regarding treatment and                            symptom management after pathology results reviewed.                           - Repeat colonoscopy is recommended. The                            colonoscopy date will be determined after pathology                            results from today's exam become available for                            review.                           - Office follow-up in 3 months. Jerene Bears, MD 04/19/2020 12:00:30 PM This report has been signed electronically.

## 2020-04-19 NOTE — Progress Notes (Signed)
[place]   Pt's states no medical or surgical changes since previsit or office visit.

## 2020-04-21 ENCOUNTER — Telehealth: Payer: Self-pay

## 2020-04-21 ENCOUNTER — Telehealth: Payer: Self-pay | Admitting: *Deleted

## 2020-04-21 NOTE — Telephone Encounter (Signed)
  Follow up Call-  Call back number 04/19/2020  Post procedure Call Back phone  # [phone] cell  Permission to leave phone message Yes  Some recent data might be hidden     Left message

## 2020-04-21 NOTE — Telephone Encounter (Signed)
  Follow up Call-  Call back number 04/19/2020  Post procedure Call Back phone  # [phone] cell  Permission to leave phone message Yes  Some recent data might be hidden    No answer at 2nd attempt follow up phone call.  Left message on voicemail.

## 2020-04-29 ENCOUNTER — Encounter: Payer: Self-pay | Admitting: Internal Medicine

## 2020-05-31 DIAGNOSIS — L03031 Cellulitis of right toe: Secondary | ICD-10-CM | POA: Diagnosis not present

## 2020-06-02 DIAGNOSIS — L03031 Cellulitis of right toe: Secondary | ICD-10-CM | POA: Diagnosis not present

## 2020-06-07 ENCOUNTER — Telehealth: Payer: Self-pay

## 2020-06-07 ENCOUNTER — Other Ambulatory Visit: Payer: Self-pay

## 2020-06-07 ENCOUNTER — Ambulatory Visit: Payer: BC Managed Care – PPO | Admitting: Podiatry

## 2020-06-07 ENCOUNTER — Encounter: Payer: Self-pay | Admitting: Podiatry

## 2020-06-07 DIAGNOSIS — L603 Nail dystrophy: Secondary | ICD-10-CM | POA: Diagnosis not present

## 2020-06-07 DIAGNOSIS — B351 Tinea unguium: Secondary | ICD-10-CM

## 2020-06-07 DIAGNOSIS — A499 Bacterial infection, unspecified: Secondary | ICD-10-CM | POA: Diagnosis not present

## 2020-06-07 DIAGNOSIS — J309 Allergic rhinitis, unspecified: Secondary | ICD-10-CM | POA: Insufficient documentation

## 2020-06-07 DIAGNOSIS — L608 Other nail disorders: Secondary | ICD-10-CM | POA: Diagnosis not present

## 2020-06-07 DIAGNOSIS — L03031 Cellulitis of right toe: Secondary | ICD-10-CM | POA: Diagnosis not present

## 2020-06-07 DIAGNOSIS — K219 Gastro-esophageal reflux disease without esophagitis: Secondary | ICD-10-CM | POA: Insufficient documentation

## 2020-06-07 DIAGNOSIS — F418 Other specified anxiety disorders: Secondary | ICD-10-CM | POA: Insufficient documentation

## 2020-06-07 DIAGNOSIS — F419 Anxiety disorder, unspecified: Secondary | ICD-10-CM | POA: Insufficient documentation

## 2020-06-07 DIAGNOSIS — R41840 Attention and concentration deficit: Secondary | ICD-10-CM | POA: Insufficient documentation

## 2020-06-07 DIAGNOSIS — J329 Chronic sinusitis, unspecified: Secondary | ICD-10-CM | POA: Insufficient documentation

## 2020-06-07 NOTE — Telephone Encounter (Signed)
Right great toenail removed today and mailed to [facility] for nail culture

## 2020-06-07 NOTE — Patient Instructions (Signed)

## 2020-06-12 ENCOUNTER — Encounter: Payer: Self-pay | Admitting: Podiatry

## 2020-06-12 ENCOUNTER — Other Ambulatory Visit: Payer: Self-pay | Admitting: Podiatry

## 2020-06-12 MED ORDER — SULFAMETHOXAZOLE-TRIMETHOPRIM 800-160 MG PO TABS: 1.0000 | ORAL_TABLET | Freq: Two times a day (BID) | ORAL | 0 refills | Status: DC

## 2020-06-12 NOTE — Progress Notes (Signed)
Subjective:   Patient ID: Angela Hill, female   DOB: 35 y.o.   MRN: [id number]   HPI 35 year old female presents the office today for concerns of right toenail issue.  She said that she had an injury 2 years ago about 2 weeks ago the nail became infected and she has been on Bactrim.  She has no swelling on the base of the nail.  The nail was very painful but somewhat better since starting antibiotics.  Denies any drainage or pus.  She states the toenail spoke to her last fall and it showed a crack in the center.  She states that it grows or gets more separated.  She has no other concerns.   Review of Systems  Genitourinary: Urgency:    All other systems reviewed and are negative.  Past Medical History:  Diagnosis Date  . Allergy   . Anxiety   . Asthma    Seasonal  . Chronic recurrent sinusitis 9/15 - present  . Depression   . Yeast vaginitis    Recurring    Past Surgical History:  Procedure Laterality Date  . BREAST BIOPSY    . Insert Mirena  4/12   removed 2015  . UPPER GASTROINTESTINAL ENDOSCOPY     2006. States she had increased emesis after eating. Unremarkable     Current Outpatient Medications:  .  fluticasone (FLONASE) 50 MCG/ACT nasal spray, Place 1 spray into both nostrils as needed., Disp: , Rfl:  .  levocetirizine (XYZAL) 5 MG tablet, Take 5 mg by mouth daily., Disp: , Rfl:  .  levonorgestrel-ethinyl estradiol (ALTAVERA) 0.15-30 MG-MCG tablet, Take one active pill daily. Skip placebo week., Disp: 84 tablet, Rfl: 2 .  sulfamethoxazole-trimethoprim (BACTRIM DS) 800-160 MG tablet, Take 1 tablet by mouth 2 (two) times daily., Disp: 14 tablet, Rfl: 0 .  SYMBICORT 80-4.5 MCG/ACT inhaler, Inhale 1 puff into the lungs as needed., Disp: , Rfl:  .  venlafaxine (EFFEXOR) 75 MG tablet, Take 75 mg by mouth daily. Takes 150 mg, Disp: , Rfl:   Allergies  Allergen Reactions  . Augmentin [Amoxicillin-Pot Clavulanate] Swelling  . Penicillins Swelling  . Codeine Nausea And  Vomiting          Objective:  Physical Exam  General: AAO x3, NAD  Dermatological: The right hallux toenails hypertrophic, dystrophic yellow to brown discoloration.  The nail is loose from underlying nail bed on the proximal nail fold there is localized edema on the nail fold.  Before the procedure there is no purulence however see procedure note below.  Vascular: Dorsalis Pedis artery and Posterior Tibial artery pedal pulses are 2/4 bilateral with immedate capillary fill time.  There is no pain with calf compression, swelling, warmth, erythema.   Neruologic: Grossly intact via light touch bilateral.  Musculoskeletal: No gross boney pedal deformities bilateral. No pain, crepitus, or limitation noted with foot and ankle range of motion bilateral. Muscular strength 5/5 in all groups tested bilateral.  Gait: Unassisted, Nonantalgic.       Assessment:   Right hallux onycholysis, localized infection; onychomycosis    Plan:  -Treatment options discussed including all alternatives, risks, and complications -Etiology of symptoms were discussed -At this time, recommended total nail removal without chemical matricectomy to the right hallux toenail.  Risks and complications were discussed with the patient for which they understand and  verbally consent to the procedure. Under sterile conditions a total of 3 mL of a mixture of 2% lidocaine plain and 0.5% Marcaine plain was infiltrated  in a hallux block fashion. Once anesthetized, the skin was prepped in sterile fashion. A tourniquet was then applied. Next the right hallux nail was excised making sure to remove the entire offending nail border.  Upon removal there was purulence identified for the proximal nail fold.  After the nail was removed the wound was debrided and the underlying skin was intact. The area was irrigated and hemostasis was obtained.  No further purulence identified.  A dry sterile dressing was applied. After application of the  dressing the tourniquet was removed and there is found to be an immediate capillary refill time to the digit. The patient tolerated the procedure well any complications. Post procedure instructions were discussed the patient for which he verbally understood. Follow-up in one week for nail check or sooner if any problems are to arise. Discussed signs/symptoms of worsening infection and directed to call the office immediately should any occur or go directly to the emergency room. In the meantime, encouraged to call the office with any questions, concerns, changes symptoms. -We will send for culture to [place] DPM

## 2020-06-23 ENCOUNTER — Ambulatory Visit: Payer: BC Managed Care – PPO | Admitting: Podiatry

## 2020-06-23 ENCOUNTER — Other Ambulatory Visit: Payer: Self-pay

## 2020-06-23 DIAGNOSIS — L03031 Cellulitis of right toe: Secondary | ICD-10-CM | POA: Diagnosis not present

## 2020-06-23 DIAGNOSIS — L603 Nail dystrophy: Secondary | ICD-10-CM | POA: Diagnosis not present

## 2020-06-29 NOTE — Progress Notes (Signed)
Subjective: 35 year old female presents the office today for follow evaluation after having right hallux total nail avulsion performed.  She said that she is doing better not having any pain, swelling or redness or any drainage.  There is an area is healed.  Also presents to discuss nail culture results.  She is back to wearing a regular shoe. Denies any systemic complaints such as fevers, chills, nausea, vomiting. No acute changes since last appointment, and no other complaints at this time.   Objective: AAO x3, NAD DP/PT pulses palpable bilaterally, CRT less than 3 seconds Right hallux nail but is healed and scab is present.  There is no drainage or pus.  No edema, erythema or signs of infection.  No open lesions. No pain with calf compression, swelling, warmth, erythema  Assessment: Right hallux onychodystrophy  Plan: -All treatment options discussed with the patient including all alternatives, risks, complications.  -Wound is healed.  I would still keep some antibiotic ointment and a Band-Aid on it during the day. -Reviewed the nail culture with her.  Negative for fungus.  Likely more from dystrophy of the nail.  We will have to monitor the nails and comes back in that it comes back in thick or discolored consider urea nail gel versus other treatment to help with the dystrophy.  -Patient encouraged to call the office with any questions, concerns, change in symptoms.   Trula Slade DPM

## 2020-09-08 DIAGNOSIS — F418 Other specified anxiety disorders: Secondary | ICD-10-CM | POA: Diagnosis not present

## 2020-09-08 DIAGNOSIS — R635 Abnormal weight gain: Secondary | ICD-10-CM | POA: Diagnosis not present

## 2020-09-08 DIAGNOSIS — R41840 Attention and concentration deficit: Secondary | ICD-10-CM | POA: Diagnosis not present

## 2020-11-28 DIAGNOSIS — J189 Pneumonia, unspecified organism: Secondary | ICD-10-CM | POA: Diagnosis not present

## 2020-11-28 DIAGNOSIS — R051 Acute cough: Secondary | ICD-10-CM | POA: Diagnosis not present

## 2021-01-11 DIAGNOSIS — K219 Gastro-esophageal reflux disease without esophagitis: Secondary | ICD-10-CM | POA: Diagnosis not present

## 2021-01-11 DIAGNOSIS — T781XXD Other adverse food reactions, not elsewhere classified, subsequent encounter: Secondary | ICD-10-CM | POA: Diagnosis not present

## 2021-01-11 DIAGNOSIS — J4530 Mild persistent asthma, uncomplicated: Secondary | ICD-10-CM | POA: Diagnosis not present

## 2021-01-11 DIAGNOSIS — J3089 Other allergic rhinitis: Secondary | ICD-10-CM | POA: Diagnosis not present

## 2021-01-23 DIAGNOSIS — F418 Other specified anxiety disorders: Secondary | ICD-10-CM | POA: Diagnosis not present

## 2021-01-23 DIAGNOSIS — Z23 Encounter for immunization: Secondary | ICD-10-CM | POA: Diagnosis not present

## 2021-01-23 DIAGNOSIS — R635 Abnormal weight gain: Secondary | ICD-10-CM | POA: Diagnosis not present

## 2021-01-26 ENCOUNTER — Other Ambulatory Visit: Payer: Self-pay

## 2021-01-26 MED ORDER — LEVONORGESTREL-ETHINYL ESTRAD 0.15-30 MG-MCG PO TABS: ORAL_TABLET | ORAL | 0 refills | Status: DC

## 2021-01-26 NOTE — Telephone Encounter (Signed)
Last AEX 06/04/2019 with Dr. Delilah Shan. Scheduled 03/13/21 with TW, NP.  Requesting refill on bcp.

## 2021-02-14 DIAGNOSIS — H66002 Acute suppurative otitis media without spontaneous rupture of ear drum, left ear: Secondary | ICD-10-CM | POA: Diagnosis not present

## 2021-02-15 DIAGNOSIS — Z79899 Other long term (current) drug therapy: Secondary | ICD-10-CM | POA: Diagnosis not present

## 2021-02-15 DIAGNOSIS — F419 Anxiety disorder, unspecified: Secondary | ICD-10-CM | POA: Diagnosis not present

## 2021-02-15 DIAGNOSIS — R41840 Attention and concentration deficit: Secondary | ICD-10-CM | POA: Diagnosis not present

## 2021-02-15 DIAGNOSIS — F902 Attention-deficit hyperactivity disorder, combined type: Secondary | ICD-10-CM | POA: Diagnosis not present

## 2021-02-28 DIAGNOSIS — N951 Menopausal and female climacteric states: Secondary | ICD-10-CM | POA: Diagnosis not present

## 2021-02-28 DIAGNOSIS — R635 Abnormal weight gain: Secondary | ICD-10-CM | POA: Diagnosis not present

## 2021-02-28 DIAGNOSIS — R5383 Other fatigue: Secondary | ICD-10-CM | POA: Diagnosis not present

## 2021-03-06 DIAGNOSIS — Z6834 Body mass index (BMI) 34.0-34.9, adult: Secondary | ICD-10-CM | POA: Diagnosis not present

## 2021-03-06 DIAGNOSIS — Z1331 Encounter for screening for depression: Secondary | ICD-10-CM | POA: Diagnosis not present

## 2021-03-06 DIAGNOSIS — N951 Menopausal and female climacteric states: Secondary | ICD-10-CM | POA: Diagnosis not present

## 2021-03-06 DIAGNOSIS — Z1339 Encounter for screening examination for other mental health and behavioral disorders: Secondary | ICD-10-CM | POA: Diagnosis not present

## 2021-03-06 DIAGNOSIS — F329 Major depressive disorder, single episode, unspecified: Secondary | ICD-10-CM | POA: Diagnosis not present

## 2021-03-06 DIAGNOSIS — R635 Abnormal weight gain: Secondary | ICD-10-CM | POA: Diagnosis not present

## 2021-03-13 ENCOUNTER — Encounter: Payer: Self-pay | Admitting: Nurse Practitioner

## 2021-03-13 ENCOUNTER — Other Ambulatory Visit: Payer: Self-pay

## 2021-03-13 ENCOUNTER — Ambulatory Visit (INDEPENDENT_AMBULATORY_CARE_PROVIDER_SITE_OTHER): Payer: BC Managed Care – PPO | Admitting: Nurse Practitioner

## 2021-03-13 VITALS — BP 118/76 | Ht 67.0 in | Wt 222.0 lb

## 2021-03-13 DIAGNOSIS — Z3041 Encounter for surveillance of contraceptive pills: Secondary | ICD-10-CM

## 2021-03-13 DIAGNOSIS — Z01419 Encounter for gynecological examination (general) (routine) without abnormal findings: Secondary | ICD-10-CM

## 2021-03-13 NOTE — Progress Notes (Signed)
° °  Angela Hill 06-20-1985 [id number]   History:  36 y.o. G0 presents for annual exam. OCPs continuously with occasional withdrawal bleed. Normal pap history. History of IBS. On Wigovy for weight management, down 10 pounds in 3 weeks.   Gynecologic History Patient's last menstrual period was 02/20/2021. Period Cycle (Days): 28 Period Duration (Days): 7 Period Pattern: Regular Menstrual Flow: Moderate, Heavy Dysmenorrhea: None Contraception/Family planning: OCP (estrogen/progesterone) Sexually active: No  Health Maintenance Last Pap: 06/04/2019. Results were: Normal, 3-year repeat Last mammogram: Not indicated Last colonoscopy: 04/19/2020. Results were: Normal Last Dexa: Not indicated  Past medical history, past surgical history, family history and social history were all reviewed and documented in the EPIC chart. 2nd grade teacher.   ROS:  A ROS was performed and pertinent positives and negatives are included.  Exam:  Vitals:   03/13/21 1458  BP: 118/76  Weight: 222 lb (100.7 kg)  Height: 5\' 7"  (1.702 m)   Body mass index is 34.77 kg/m.  General appearance:  Normal Thyroid:  Symmetrical, normal in size, without palpable masses or nodularity. Respiratory  Auscultation:  Clear without wheezing or rhonchi Cardiovascular  Auscultation:  Regular rate, without rubs, murmurs or gallops  Edema/varicosities:  Not grossly evident Abdominal  Soft,nontender, without masses, guarding or rebound.  Liver/spleen:  No organomegaly noted  Hernia:  None appreciated  Skin  Inspection:  Grossly normal Breasts: Examined lying and sitting.   Right: Without masses, retractions, nipple discharge or axillary adenopathy.   Left: Without masses, retractions, nipple discharge or axillary adenopathy. Genitourinary   Inguinal/mons:  Normal without inguinal adenopathy  External genitalia:  Normal appearing vulva with no masses, tenderness, or lesions  BUS/Urethra/Skene's glands:   Normal  Vagina:  Normal appearing with normal color and discharge, no lesions  Cervix:  Normal appearing without discharge or lesions  Uterus:  Normal in size, shape and contour.  Midline and mobile, nontender  Adnexa/parametria:     Rt: Normal in size, without masses or tenderness.   Lt: Normal in size, without masses or tenderness.  Anus and perineum: Normal  Patient informed chaperone available to be present for breast and pelvic exam. Patient has requested no chaperone to be present. Patient has been advised what will be completed during breast and pelvic exam.   Assessment/Plan:  36 y.o. G0 for annual exam.   Well female exam with routine gynecological exam - Education provided on SBEs, importance of preventative screenings, current guidelines, high calcium diet, regular exercise, and multivitamin daily. Labs with PCP.   Encounter for surveillance of contraceptive pills - OCPs continuously with occasional withdrawal bleed. Taking as prescribed. Does not need refill at this time.   Screening for cervical cancer - Normal Pap history.  Will repeat at 3-year interval per guidelines.  Return in 1 year for annual.     Tamela Gammon DNP, 3:23 PM 03/13/2021

## 2021-03-14 DIAGNOSIS — E669 Obesity, unspecified: Secondary | ICD-10-CM | POA: Diagnosis not present

## 2021-03-14 DIAGNOSIS — Z6835 Body mass index (BMI) 35.0-35.9, adult: Secondary | ICD-10-CM | POA: Diagnosis not present

## 2021-03-21 ENCOUNTER — Other Ambulatory Visit: Payer: Self-pay

## 2021-03-21 DIAGNOSIS — Z789 Other specified health status: Secondary | ICD-10-CM | POA: Diagnosis not present

## 2021-03-21 DIAGNOSIS — Z6834 Body mass index (BMI) 34.0-34.9, adult: Secondary | ICD-10-CM | POA: Diagnosis not present

## 2021-03-21 MED ORDER — LEVONORGESTREL-ETHINYL ESTRAD 0.15-30 MG-MCG PO TABS: ORAL_TABLET | ORAL | 4 refills | Status: DC

## 2021-03-21 NOTE — Telephone Encounter (Signed)
AEX 03/13/21.

## 2021-03-31 ENCOUNTER — Other Ambulatory Visit: Payer: Self-pay | Admitting: Nurse Practitioner

## 2021-05-09 DIAGNOSIS — F419 Anxiety disorder, unspecified: Secondary | ICD-10-CM | POA: Diagnosis not present

## 2021-05-09 DIAGNOSIS — Z79899 Other long term (current) drug therapy: Secondary | ICD-10-CM | POA: Diagnosis not present

## 2021-05-09 DIAGNOSIS — F902 Attention-deficit hyperactivity disorder, combined type: Secondary | ICD-10-CM | POA: Diagnosis not present

## 2021-05-10 DIAGNOSIS — F902 Attention-deficit hyperactivity disorder, combined type: Secondary | ICD-10-CM | POA: Diagnosis not present

## 2021-05-10 DIAGNOSIS — Z79899 Other long term (current) drug therapy: Secondary | ICD-10-CM | POA: Diagnosis not present

## 2021-06-09 DIAGNOSIS — M25532 Pain in left wrist: Secondary | ICD-10-CM | POA: Diagnosis not present

## 2021-06-23 DIAGNOSIS — M25532 Pain in left wrist: Secondary | ICD-10-CM | POA: Diagnosis not present

## 2021-08-01 DIAGNOSIS — R635 Abnormal weight gain: Secondary | ICD-10-CM | POA: Diagnosis not present

## 2021-08-01 DIAGNOSIS — F909 Attention-deficit hyperactivity disorder, unspecified type: Secondary | ICD-10-CM | POA: Diagnosis not present

## 2021-08-01 DIAGNOSIS — Z5181 Encounter for therapeutic drug level monitoring: Secondary | ICD-10-CM | POA: Diagnosis not present

## 2021-08-01 DIAGNOSIS — F418 Other specified anxiety disorders: Secondary | ICD-10-CM | POA: Diagnosis not present

## 2021-08-17 ENCOUNTER — Other Ambulatory Visit: Payer: Self-pay | Admitting: Nurse Practitioner

## 2021-08-21 ENCOUNTER — Telehealth: Payer: Self-pay

## 2021-08-21 NOTE — Telephone Encounter (Signed)
I received message from front desk that patient needs refill on bcp. I called patient and left detailed message (per DPR) that year's Rx was sent to [facility] and she can obtain through them.  However, if her pharmacy has changed she should let us know and we can resent the Rx.

## 2021-08-24 ENCOUNTER — Other Ambulatory Visit: Payer: Self-pay

## 2021-08-24 MED ORDER — LEVONORGESTREL-ETHINYL ESTRAD 0.15-30 MG-MCG PO TABS
ORAL_TABLET | ORAL | 2 refills | Status: DC
Start: 2021-08-24 — End: 2022-02-15

## 2021-08-24 NOTE — Telephone Encounter (Signed)
Ok to send

## 2021-08-24 NOTE — Telephone Encounter (Signed)
Patient called. Needs her bcp Rx sent to [facility]. She said at 03/13/21 AEX Rx was in error send to [facility] and they never filled it. She is completely out and going [place] of [place] on [place].

## 2021-09-27 ENCOUNTER — Encounter (INDEPENDENT_AMBULATORY_CARE_PROVIDER_SITE_OTHER): Payer: Self-pay

## 2021-10-09 DIAGNOSIS — F330 Major depressive disorder, recurrent, mild: Secondary | ICD-10-CM | POA: Diagnosis not present

## 2021-11-23 ENCOUNTER — Emergency Department (HOSPITAL_BASED_OUTPATIENT_CLINIC_OR_DEPARTMENT_OTHER): Payer: BC Managed Care – PPO

## 2021-11-23 ENCOUNTER — Emergency Department (HOSPITAL_BASED_OUTPATIENT_CLINIC_OR_DEPARTMENT_OTHER): Payer: BC Managed Care – PPO | Admitting: Radiology

## 2021-11-23 ENCOUNTER — Emergency Department (HOSPITAL_BASED_OUTPATIENT_CLINIC_OR_DEPARTMENT_OTHER)
Admission: EM | Admit: 2021-11-23 | Discharge: 2021-11-23 | Disposition: A | Discharge status: Home / Self Care | Payer: BC Managed Care – PPO | Attending: Emergency Medicine | Admitting: Emergency Medicine

## 2021-11-23 DIAGNOSIS — D72829 Elevated white blood cell count, unspecified: Secondary | ICD-10-CM | POA: Diagnosis not present

## 2021-11-23 DIAGNOSIS — R072 Precordial pain: Secondary | ICD-10-CM | POA: Insufficient documentation

## 2021-11-23 DIAGNOSIS — F419 Anxiety disorder, unspecified: Secondary | ICD-10-CM | POA: Insufficient documentation

## 2021-11-23 DIAGNOSIS — R000 Tachycardia, unspecified: Secondary | ICD-10-CM | POA: Diagnosis not present

## 2021-11-23 DIAGNOSIS — R0600 Dyspnea, unspecified: Secondary | ICD-10-CM | POA: Diagnosis not present

## 2021-11-23 DIAGNOSIS — R079 Chest pain, unspecified: Secondary | ICD-10-CM | POA: Diagnosis not present

## 2021-11-23 LAB — CBC
HCT: 36.9 % (ref 36.0–46.0)
Hemoglobin: 12.4 g/dL (ref 12.0–15.0)
MCH: 29 pg (ref 26.0–34.0)
MCHC: 33.6 g/dL (ref 30.0–36.0)
MCV: 86.4 fL (ref 80.0–100.0)
Platelets: 260 10*3/uL (ref 150–400)
RBC: 4.27 MIL/uL (ref 3.87–5.11)
RDW: 13.1 % (ref 11.5–15.5)
WBC: 10.6 10*3/uL — ABNORMAL HIGH (ref 4.0–10.5)
nRBC: 0 % (ref 0.0–0.2)

## 2021-11-23 LAB — BASIC METABOLIC PANEL
Anion gap: 11 (ref 5–15)
BUN: 10 mg/dL (ref 6–20)
CO2: 25 mmol/L (ref 22–32)
Calcium: 9.3 mg/dL (ref 8.9–10.3)
Chloride: 101 mmol/L (ref 98–111)
Creatinine, Ser: 0.71 mg/dL (ref 0.44–1.00)
GFR, Estimated: >60 mL/min (ref >60)
Glucose, Bld: 111 mg/dL — ABNORMAL HIGH (ref 70–99)
Potassium: 3.6 mmol/L (ref 3.5–5.1)
Sodium: 137 mmol/L (ref 135–145)

## 2021-11-23 LAB — HCG, SERUM, QUALITATIVE: Preg, Serum: NEGATIVE

## 2021-11-23 LAB — TROPONIN I (HIGH SENSITIVITY)
Troponin I (High Sensitivity): <2 ng/L (ref <18)
Troponin I (High Sensitivity): 3 ng/L (ref <18)

## 2021-11-23 MED ORDER — NAPROXEN 500 MG PO TABS: 500.0000 mg | ORAL_TABLET | Freq: Two times a day (BID) | ORAL | 0 refills | Status: AC

## 2021-11-23 MED ORDER — IOHEXOL 350 MG/ML SOLN
75.0000 mL | Freq: Once | INTRAVENOUS | Status: AC | PRN
  Administered 2021-11-23: 75 mL via INTRAVENOUS

## 2021-11-23 MED ORDER — LORAZEPAM 1 MG PO TABS
1.0000 mg | ORAL_TABLET | Freq: Once | ORAL | Status: AC
  Administered 2021-11-23: 1 mg via ORAL
  Filled 2021-11-23: qty 1

## 2021-11-23 MED ORDER — LORAZEPAM 1 MG PO TABS: 1.0000 mg | ORAL_TABLET | Freq: Two times a day (BID) | ORAL | 0 refills | Status: DC

## 2021-11-23 NOTE — Discharge Instructions (Signed)
Your work-up here in the emergency department was reassuring.  Could possibly be costochondritis which is inflammation of the chest wall muscles versus some anxiety.  I have written you for a short course of some additional anxiety medication.  You may start by taking half a tablet every 8 hours.  Follow-up with primary care provider, return for new or worsening symptoms.

## 2021-11-23 NOTE — ED Notes (Signed)
Patient transported to CT 

## 2021-11-23 NOTE — ED Notes (Signed)
Pt does feel better after Ativan given rates her pain 3-4/10

## 2021-11-23 NOTE — ED Provider Notes (Signed)
[facility] EMERGENCY DEPT Provider Note   CSN: [id number] Arrival date & time: 11/23/21  1737    History  Chief Complaint  Patient presents with   Chest Pain    Angela Hill is a 36 y.o. female history of anxiety, on OCPs here for evaluation of chest pain.  Diffuse tightness across her chest.  Feels like she cannot take a deep breath.  Patient's pain is pleuritic.  Not exertional in nature.  Does states she had the anniversary of her mother's death yesterday and she has been more anxious than normal however typically her anxiety resolves.  No back pain, jaw pain or arm pain.  No lower extremity swelling or pain.  No history of PE or DVT.  No fever, emesis, cough, abdominal pain, nausea or vomiting.  HPI     Home Medications Prior to Admission medications   Medication Sig Start Date End Date Taking? Authorizing Provider  LORazepam (ATIVAN) 1 MG tablet Take 1 tablet (1 mg total) by mouth every 12 (twelve) hours. 11/23/21  Yes Jemarion Roycroft A, PA-C  naproxen (NAPROSYN) 500 MG tablet Take 1 tablet (500 mg total) by mouth 2 (two) times daily. 11/23/21  Yes Eather Chaires A, PA-C  amphetamine-dextroamphetamine (ADDERALL XR) 10 MG 24 hr capsule     [provider]  fluticasone (FLONASE) 50 MCG/ACT nasal spray Place 1 spray into both nostrils as needed. 08/31/19   [provider]  levocetirizine (XYZAL) 5 MG tablet Take 5 mg by mouth daily. 10/27/19   [provider]  levonorgestrel-ethinyl estradiol Thana Ates) 0.15-30 MG-MCG tablet Take one active pill daily. Skip placebo week. 08/24/21   Chrzanowski, Jami B, NP  SYMBICORT 80-4.5 MCG/ACT inhaler Inhale 1 puff into the lungs as needed. 10/21/19   [provider]  venlafaxine (EFFEXOR) 75 MG tablet Take 225 mg by mouth daily. Takes 150 mg    [provider]  WEGOVY 0.25 MG/0.5ML SOAJ Inject into the skin. 02/10/21   [provider]      Allergies    Augmentin  [amoxicillin-pot clavulanate], Penicillins, and Codeine    Review of Systems   Review of Systems  Constitutional: Negative.   HENT: Negative.    Respiratory:  Positive for chest tightness and shortness of breath. Negative for apnea, cough, choking, wheezing and stridor.   Cardiovascular:  Positive for chest pain.  Gastrointestinal: Negative.   Genitourinary: Negative.   Musculoskeletal: Negative.   Skin: Negative.   Neurological: Negative.   All other systems reviewed and are negative.   Physical Exam Updated Vital Signs BP 123/76   Pulse 88   Temp 99.2 F (37.3 C)   Resp 18   Ht '5\' 7"'$  (1.702 m)   Wt 81.6 kg   LMP 11/16/2021   SpO2 100%   BMI 28.19 kg/m  Physical Exam Vitals and nursing note reviewed.  Constitutional:      General: She is not in acute distress.    Appearance: She is well-developed. She is not ill-appearing, toxic-appearing or diaphoretic.  HENT:     Head: Normocephalic and atraumatic.  Eyes:     Pupils: Pupils are equal, round, and reactive to light.  Cardiovascular:     Rate and Rhythm: Tachycardia present.     Pulses:          Radial pulses are 2+ on the right side and 2+ on the left side.       Dorsalis pedis pulses are 2+ on the right side and 2+ on the  left side.     Heart sounds: Normal heart sounds.     Comments: Mild tachycardia low 100's Pulmonary:     Effort: Pulmonary effort is normal. No respiratory distress.     Breath sounds: Normal breath sounds.     Comments: Clear bil, speaks in full sentences without difficulty Chest:     Chest wall: No mass, deformity, tenderness or crepitus.     Comments: Non tender Abdominal:     General: Bowel sounds are normal. There is no distension.     Palpations: Abdomen is soft.     Comments: Soft non tender  Musculoskeletal:        General: Normal range of motion.     Cervical back: Normal range of motion.     Right lower leg: No tenderness. No edema.     Left lower leg: No tenderness. No edema.      Comments: No bony tenderness, full range of motion, compartment soft, Homans' sign negative bilaterally  Skin:    General: Skin is warm and dry.     Capillary Refill: Capillary refill takes less than 2 seconds.     Comments: No edema, erythema or warmth.  No fluctuance or induration.  Neurological:     General: No focal deficit present.     Mental Status: She is alert.  Psychiatric:        Mood and Affect: Mood normal.     ED Results / Procedures / Treatments   Labs (all labs ordered are listed, but only abnormal results are displayed) Labs Reviewed  BASIC METABOLIC PANEL - Abnormal; Notable for the following components:      Result Value   Glucose, Bld 111 (*)    All other components within normal limits  CBC - Abnormal; Notable for the following components:   WBC 10.6 (*)    All other components within normal limits  HCG, SERUM, QUALITATIVE  TROPONIN I (HIGH SENSITIVITY)  TROPONIN I (HIGH SENSITIVITY)    EKG None  Radiology CT Angio Chest PE W and/or Wo Contrast  Result Date: 11/23/2021 CLINICAL DATA:  Pulmonary embolism (PE) suspected, high prob. Chest pain. Dyspnea. EXAM: CT ANGIOGRAPHY CHEST WITH CONTRAST TECHNIQUE: Multidetector CT imaging of the chest was performed using the standard protocol during bolus administration of intravenous contrast. Multiplanar CT image reconstructions and MIPs were obtained to evaluate the vascular anatomy. RADIATION DOSE REDUCTION: This exam was performed according to the departmental dose-optimization program which includes automated exposure control, adjustment of the mA and/or kV according to patient size and/or use of iterative reconstruction technique. CONTRAST:  31m OMNIPAQUE IOHEXOL 350 MG/ML SOLN COMPARISON:  None Available. FINDINGS: Cardiovascular: Adequate opacification of the pulmonary arterial tree. No intraluminal filling defect identified to suggest acute pulmonary embolism. Central pulmonary arteries are of normal caliber.  No significant coronary artery calcification. Cardiac size within normal limits. No pericardial effusion. No significant atherosclerotic calcification within the thoracic aorta. No aortic aneurysm. Mediastinum/Nodes: No enlarged mediastinal, hilar, or axillary lymph nodes. Thyroid gland, trachea, and esophagus demonstrate no significant findings. Lungs/Pleura: Lungs are clear. No pleural effusion or pneumothorax. Upper Abdomen: No acute abnormality. Musculoskeletal: No chest wall abnormality. No acute or significant osseous findings. Review of the MIP images confirms the above findings. IMPRESSION: No pulmonary embolism. Normal examination. Electronically Signed   By: AFidela SalisburyM.D.   On: 11/23/2021 21:01   DG Chest 2 View  Result Date: 11/23/2021 CLINICAL DATA:  Chest pain. EXAM: CHEST - 2 VIEW COMPARISON:  None  Available. FINDINGS: The heart size and mediastinal contours are within normal limits. Both lungs are clear. The visualized skeletal structures are unremarkable. IMPRESSION: No active cardiopulmonary disease. Electronically Signed   By: Lovey Newcomer M.D.   On: 11/23/2021 18:21    Procedures Procedures    Medications Ordered in ED Medications  LORazepam (ATIVAN) tablet 1 mg (1 mg Oral Given 11/23/21 1956)  iohexol (OMNIPAQUE) 350 MG/ML injection 75 mL (75 mLs Intravenous Contrast Given 11/23/21 2044)    ED Course/ Medical Decision Making/ A&P    36 year old here for evaluation of chest pain over last 48 hours.  She is afebrile, nonseptic, normal.  Chest pain nonpositional nature, no clinical evidence of VTE on exam.  Cannot PERC she is mildly tachycardic in the room she is on OCPs.  No recent URI symptoms.  Of note she did recently have anniversary of her mother's death when symptoms started.  Does admit to history of anxiety however states typically her anxiety attacks resolved by now.  Lungs clear.  Abdomen soft, nontender.  Plan on labs, imaging and reassess  Labs and imaging  personally viewed and interpreted:  CBC leukocytosis 47.4 Metabolic panel glucose 259 Pregnancy test negative Troponin delta flat neg Chest x-ray that cardiomegaly, pulmonary edema, pneumothorax, infiltrates CTA negative for PE, infectious process EKG without ischemic changes  Patient reassessed.  Symptoms improved after Ativan.  Possibly had some costochondritis as well.  We will treat as such.  At this time low suspicion for acute ACS, PE, dissection, Boerhaave rupture, pneumothorax, myocarditis, pericarditis.  Encourage close follow-up with PCP, return for new or worsening symptoms.  Heart score 0  The patient has been appropriately medically screened and/or stabilized in the ED. I have low suspicion for any other emergent medical condition which would require further screening, evaluation or treatment in the ED or require inpatient management.  Patient is hemodynamically stable and in no acute distress.  Patient able to ambulate in department prior to ED.  Evaluation does not show acute pathology that would require ongoing or additional emergent interventions while in the emergency department or further inpatient treatment.  I have discussed the diagnosis with the patient and answered all questions.  Pain is been managed while in the emergency department and patient has no further complaints prior to discharge.  Patient is comfortable with plan discussed in room and is stable for discharge at this time.  I have discussed strict return precautions for returning to the emergency department.  Patient was encouraged to follow-up with PCP/specialist refer to at discharge.                            Medical Decision Making Amount and/or Complexity of Data Reviewed Independent Historian: friend External Data Reviewed: labs, radiology, ECG and notes. Labs: ordered. Decision-making details documented in ED Course. Radiology: ordered and independent interpretation performed. Decision-making  details documented in ED Course. ECG/medicine tests: ordered and independent interpretation performed. Decision-making details documented in ED Course.  Risk OTC drugs. Prescription drug management. Decision regarding hospitalization. Diagnosis or treatment significantly limited by social determinants of health.          Final Clinical Impression(s) / ED Diagnoses Final diagnoses:  Precordial pain  Anxiety    Rx / DC Orders ED Discharge Orders          Ordered    LORazepam (ATIVAN) 1 MG tablet  Every 12 hours        11/23/21  2123    naproxen (NAPROSYN) 500 MG tablet  2 times daily        11/23/21 2123              Kamie Korber A, PA-C 11/23/21 2127    Cristie Hem, MD 11/23/21 2253

## 2021-11-23 NOTE — ED Triage Notes (Signed)
Pt c/o chest pain x 48 hours, with pain worse from 9a-5p. Pt endorses SHOB. Pain is on both sides of chest.  Pt does hx of panic attacks- pt states she has trauma from 2 years ago and last year, which also occurred yesterday. Anniversary of her mother's death.

## 2021-11-23 NOTE — ED Notes (Signed)
Pt continues to c/o chest pain despite ativan given

## 2021-11-28 DIAGNOSIS — F330 Major depressive disorder, recurrent, mild: Secondary | ICD-10-CM | POA: Diagnosis not present

## 2021-11-30 DIAGNOSIS — E669 Obesity, unspecified: Secondary | ICD-10-CM | POA: Diagnosis not present

## 2021-11-30 DIAGNOSIS — F411 Generalized anxiety disorder: Secondary | ICD-10-CM | POA: Diagnosis not present

## 2021-11-30 DIAGNOSIS — R000 Tachycardia, unspecified: Secondary | ICD-10-CM | POA: Diagnosis not present

## 2021-11-30 DIAGNOSIS — R079 Chest pain, unspecified: Secondary | ICD-10-CM | POA: Diagnosis not present

## 2021-12-01 DIAGNOSIS — F419 Anxiety disorder, unspecified: Secondary | ICD-10-CM | POA: Diagnosis not present

## 2021-12-01 DIAGNOSIS — Z79899 Other long term (current) drug therapy: Secondary | ICD-10-CM | POA: Diagnosis not present

## 2021-12-01 DIAGNOSIS — F902 Attention-deficit hyperactivity disorder, combined type: Secondary | ICD-10-CM | POA: Diagnosis not present

## 2021-12-04 DIAGNOSIS — Z79899 Other long term (current) drug therapy: Secondary | ICD-10-CM | POA: Diagnosis not present

## 2021-12-25 NOTE — Progress Notes (Signed)
CARDIOLOGY CONSULT NOTE       Patient ID: Angela Hill MRN: [id number] DOB/AGE: 37-Apr-1987 37 y.o.  Admit date: (Not on file) Referring Physician: Orland Hill Primary Physician: Angela Pepper, MD Primary Cardiologist: New Reason for Consultation: Tachycardia  Active Problems:   * No active hospital problems. *   HPI:  36 y.o. referred by Dr Angela Hill for tachycardia. History of Anxiety, ADHD, Asthma She is on Adderall , Effexor and Ativan Since October has had more issues with tachycardia.  Sudden onset at work and home Initial episodes in October lasted all day Seen at [facility] ED 11/23/21 with diffuse tightness in chest and dyspnea ? 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 [place]  She is from [place] and came to [place] with parents when she was 42 yo She has 3 siblings in [place] area [place] to walk on treadmill for 30 minutes but getting more winded Denies drugs or other stimulants besides meds    ROS All other systems reviewed and negative except as noted above  Past Medical History:  Diagnosis Date   ADHD    Allergy    Anxiety    Asthma    Seasonal   Chest pain    Chronic recurrent sinusitis 9/15 - present   Depression    Obesity    Tachycardia    Yeast vaginitis    Recurring    Family History  Problem Relation Age of Onset   Hypertension Mother    Cirrhosis Mother    Depression Mother    Anxiety disorder Mother    Dementia Father    AAA (abdominal aortic aneurysm) Father    Healthy Sister    GI Disease Brother        GERD   Healthy Brother    Other Maternal Grandmother        unknown   Other Maternal Grandfather        unknown   Other Paternal Grandmother        unknown   Other Paternal  Grandfather        unknown   Colon cancer Neg Hx    Stomach cancer Neg Hx    Esophageal cancer Neg Hx    Pancreatic cancer Neg Hx    Rectal cancer Neg Hx     Social History   Socioeconomic History   Marital status: Single    Spouse name: Not on file   Number of children: Not on file   Years of education: Not on file   Highest education level: Not on file  Occupational History   Not on file  Tobacco Use   Smoking status: Never   Smokeless tobacco: Never  Vaping Use   Vaping Use: Never used  Substance and Sexual Activity   Alcohol use: Yes    Comment: Social   Drug use: No   Sexual activity: Not Currently    Birth control/protection: Pill    Comment: 1 st intercourse17 yo-Fewer than 5 partners  Other Topics Concern   Not on file  Social History Narrative   Not on file   Social Determinants of Health   Financial Resource Strain: Not on file  Food Insecurity: Not on file  Transportation Needs: Not on file  Physical  Activity: Not on file  Stress: Not on file  Social Connections: Not on file  Intimate Partner Violence: Not on file    Past Surgical History:  Procedure Laterality Date   BREAST BIOPSY     Insert Mirena  4/12   removed 2015   UPPER GASTROINTESTINAL ENDOSCOPY     2006. States she had increased emesis after eating. Unremarkable      Current Outpatient Medications:    albuterol (VENTOLIN HFA) 108 (90 Base) MCG/ACT inhaler, Inhale 1-2 puffs into the lungs every 6 (six) hours as needed., Disp: , Rfl:    amphetamine-dextroamphetamine (ADDERALL XR) 20 MG 24 hr capsule, Take 20 mg by mouth daily., Disp: , Rfl:    levonorgestrel-ethinyl estradiol (ALTAVERA) 0.15-30 MG-MCG tablet, Take one active pill daily. Skip placebo week., Disp: 84 tablet, Rfl: 2   naproxen (NAPROSYN) 500 MG tablet, Take 1 tablet (500 mg total) by mouth 2 (two) times daily., Disp: 30 tablet, Rfl: 0   SYMBICORT 80-4.5 MCG/ACT inhaler, Inhale 1 puff into the lungs as needed., Disp: , Rfl:     Venlafaxine HCl 225 MG TB24, Take 1 tablet by mouth daily., Disp: , Rfl:    WEGOVY 0.25 MG/0.5ML SOAJ, Inject into the skin., Disp: , Rfl:     Physical Exam: Blood pressure 121/78, pulse (!) 119, height '5\' 7"'$  (1.702 m), weight 190 lb (86.2 kg), SpO2 98 %.    Affect appropriate Healthy:  appears stated age 36: normal Neck supple with no adenopathy JVP normal no bruits no thyromegaly Lungs clear with no wheezing and good diaphragmatic motion Heart:  S1/S2 no murmur, no rub, gallop or click PMI normal Abdomen: benighn, BS positve, no tenderness, no AAA no bruit.  No HSM or HJR Distal pulses intact with no bruits No edema Neuro non-focal Skin warm and dry No muscular weakness   Labs:   Lab Results  Component Value Date   WBC 10.6 (H) 11/23/2021   HGB 12.4 11/23/2021   HCT 36.9 11/23/2021   MCV 86.4 11/23/2021   PLT 260 11/23/2021   No results for input(s): "NA", "K", "CL", "CO2", "BUN", "CREATININE", "CALCIUM", "PROT", "BILITOT", "ALKPHOS", "ALT", "AST", "GLUCOSE" in the last 168 hours.  Invalid input(s): "LABALBU" No results found for: "CKTOTAL", "CKMB", "CKMBINDEX", "TROPONINI"  Lab Results  Component Value Date   CHOL 190 06/04/2019   CHOL 178 09/22/2015   CHOL 174 06/21/2014   Lab Results  Component Value Date   HDL 70 06/04/2019   HDL 71 09/22/2015   Lab Results  Component Value Date   LDLCALC 101 (H) 06/04/2019   LDLCALC 93 09/22/2015   Lab Results  Component Value Date   TRIG 93 06/04/2019   TRIG 72 09/22/2015   Lab Results  Component Value Date   CHOLHDL 2.7 06/04/2019   CHOLHDL 2.5 09/22/2015   No results found for: "LDLDIRECT"    Radiology: No results found.  EKG: SR rate 94 short PR 118 msec no pre excitation non specific ST changes    ASSESSMENT AND PLAN:   Tachcyardia:  in setting of ADHD, Anxiety on Adderall, Effexor and Ativan ECG normal High adrenergic tone as evidenced by short PR no Pre excitation Hct 36.9  TSH normal 2021   Discussed options Showed her [facility] monitor and favor starting with that rather than formal 30 day. Will get exercise stress echo to address exercise intolerance, chest pain and dyspnea Inderal PRN for tachycardia/palpitations She will discuss with primary coming off Effexor and Adderall as  they are not great meds for someone worried about tachycardia and possible SVT  F/U cardiology 4-6 weeks   Signed: Jenkins Rouge 01/04/2022, 4:02 PM

## 2022-01-04 ENCOUNTER — Encounter: Payer: Self-pay | Admitting: Cardiovascular Disease

## 2022-01-04 ENCOUNTER — Ambulatory Visit: Payer: BC Managed Care – PPO | Attending: Cardiovascular Disease | Admitting: Cardiovascular Disease

## 2022-01-04 VITALS — BP 121/78 | HR 119 | Ht 67.0 in | Wt 190.0 lb

## 2022-01-04 DIAGNOSIS — R000 Tachycardia, unspecified: Secondary | ICD-10-CM

## 2022-01-04 MED ORDER — PROPRANOLOL HCL 10 MG PO TABS: 10.0000 mg | ORAL_TABLET | ORAL | 2 refills | Status: AC | PRN

## 2022-01-04 NOTE — Patient Instructions (Addendum)
Medication Instructions:  Your physician has recommended you make the following change in your medication:  1- TAKE inderal 10 mg by mouth as needed for elevated heart rate.  *If you need a refill on your cardiac medications before your next appointment, please call your pharmacy*  Lab Work: If you have labs (blood work) drawn today and your tests are completely normal, you will receive your results only by: [facility] (if you have MyChart) OR A paper copy in the mail If you have any lab test that is abnormal or we need to change your treatment, we will call you to review the results.  Testing/Procedures: Your physician has requested that you have a stress echocardiogram. For further information please visit [email or web address]. Please follow instruction sheet as given.  Follow-Up: At [facility], you and your health needs are our priority.  As part of our continuing mission to provide you with exceptional heart care, we have created designated Provider Care Teams.  These Care Teams include your primary Cardiologist (physician) and Advanced Practice Providers (APPs -  Physician Assistants and Nurse Practitioners) who all work together to provide you with the care you need, when you need it.  We recommend signing up for the patient portal called "MyChart".  Sign up information is provided on this After Visit Summary.  MyChart is used to connect with patients for Virtual Visits (Telemedicine).  Patients are able to view lab/test results, encounter notes, upcoming appointments, etc.  Non-urgent messages can be sent to your provider as well.   To learn more about what you can do with MyChart, go to [email or web address].    Your next appointment:   6 week(s)  The format for your next appointment:   In Person  Provider:   Nicholes Rough, PA-C, Melina Copa, PA-C, Ambrose Pancoast, NP, Cecilie Kicks, NP, Ermalinda Barrios, PA-C, Christen Bame, NP, or Richardson Dopp, PA-C        Important  Information About Sugar

## 2022-01-10 ENCOUNTER — Other Ambulatory Visit (HOSPITAL_BASED_OUTPATIENT_CLINIC_OR_DEPARTMENT_OTHER): Payer: Self-pay

## 2022-01-10 MED ORDER — WEGOVY 0.25 MG/0.5ML ~~LOC~~ SOAJ
0.2500 mg | SUBCUTANEOUS | 0 refills | Status: AC
  Filled 2022-01-10 – 2022-03-08 (×2): qty 2, 28d supply, fill #0

## 2022-01-17 ENCOUNTER — Telehealth (HOSPITAL_COMMUNITY): Payer: Self-pay | Admitting: *Deleted

## 2022-01-17 DIAGNOSIS — F330 Major depressive disorder, recurrent, mild: Secondary | ICD-10-CM | POA: Diagnosis not present

## 2022-01-17 NOTE — Telephone Encounter (Signed)
Left message with instructions for upcoming stress echo.  Angela Hill

## 2022-01-19 ENCOUNTER — Other Ambulatory Visit (HOSPITAL_BASED_OUTPATIENT_CLINIC_OR_DEPARTMENT_OTHER): Payer: Self-pay

## 2022-01-19 MED ORDER — WEGOVY 0.25 MG/0.5ML ~~LOC~~ SOAJ
0.2500 mg | SUBCUTANEOUS | 0 refills | Status: AC
  Filled 2022-01-19: qty 2, 28d supply, fill #0

## 2022-01-21 ENCOUNTER — Other Ambulatory Visit (HOSPITAL_BASED_OUTPATIENT_CLINIC_OR_DEPARTMENT_OTHER): Payer: Self-pay

## 2022-01-24 ENCOUNTER — Ambulatory Visit (HOSPITAL_COMMUNITY): Payer: BC Managed Care – PPO | Attending: Cardiovascular Disease

## 2022-01-24 ENCOUNTER — Other Ambulatory Visit: Payer: Self-pay | Admitting: Cardiovascular Disease

## 2022-01-24 ENCOUNTER — Ambulatory Visit (HOSPITAL_COMMUNITY): Payer: BC Managed Care – PPO | Attending: Cardiology

## 2022-01-24 DIAGNOSIS — R000 Tachycardia, unspecified: Secondary | ICD-10-CM | POA: Insufficient documentation

## 2022-01-24 LAB — ECHOCARDIOGRAM COMPLETE
AR max vel: 2.6 cm2
AV Area VTI: 2.8 cm2
AV Area mean vel: 2.56 cm2
AV Mean grad: 9 mmHg
AV Peak grad: 16.2 mmHg
Ao pk vel: 2.02 m/s
Area-P 1/2: 4.71 cm2
S' Lateral: 2.4 cm

## 2022-01-24 MED ORDER — PERFLUTREN LIPID MICROSPHERE
1.0000 mL | INTRAVENOUS | Status: AC | PRN
  Administered 2022-01-24: 2 mL via INTRAVENOUS

## 2022-02-07 DIAGNOSIS — F330 Major depressive disorder, recurrent, mild: Secondary | ICD-10-CM | POA: Diagnosis not present

## 2022-02-13 NOTE — Progress Notes (Unsigned)
Office Visit    Patient Name: Angela Hill Date of Encounter: 02/15/2022  Primary Care Provider:  London Pepper, MD Primary Cardiologist:  Jenkins Rouge, MD Primary Electrophysiologist: None  Chief Complaint    Angela Hill is a 36 y.o. female with PMH of tachycardia who presents today for 6-week follow-up.  Past Medical History    Past Medical History:  Diagnosis Date   ADHD    Allergy    Anxiety    Asthma    Seasonal   Chest pain    Chronic recurrent sinusitis 9/15 - present   Depression    Obesity    Tachycardia    Yeast vaginitis    Recurring   Past Surgical History:  Procedure Laterality Date   BREAST BIOPSY     Insert Mirena  4/12   removed 2015   UPPER GASTROINTESTINAL ENDOSCOPY     2006. States she had increased emesis after eating. Unremarkable    Allergies  Allergies  Allergen Reactions   Augmentin [Amoxicillin-Pot Clavulanate] Swelling   Penicillins Swelling   Codeine Nausea And Vomiting   HPI:  Angela Hill  is a 36 year old female with the above mention past medical history who presents today for 6-week follow-up of tachycardia.  Ms. Kunda was seen by Dr. Johnsie Cancel on 01/04/2022 complaining of tachycardia.  She has a history of ADHD and is currently taking Adderall, Effexor and Ativan.  She reported a sudden onset of tachycardia while at work.  She is a second grade Education officer, [employer].  She was seen at [facility] on 11/23/2021 with complaints of shortness of breath and chest tightness.  ECG was completed with no arrhythmia or acute changes and CT of the chest showed no PE.  She was discharged with naproxen and Ativan for presumed anxiety and costochondritis.  She was encouraged to contact her PCP regarding coming off Effexor and Adderall.  She was also encouraged to purchase a Kardia monitor for evaluation of tachycardia.  Ms. Edrington presents today for 1 month follow-up alone.  Since last being seen in the office patient reports  that she is still having episodes of tachycardia that occur mainly while at work while teaching.  She states that her job is very stressful and this is a major trigger for her tachycardia.  She also reports that she lost both of her parents over the last 2 years and this has been a major source of stress for her as well.  Her blood pressure today was well-controlled at 122/82 and heart rate today was 97 bpm.  She was able to discuss with her PCP regarding decreasing her Effexor and was given clearance to try the dose of 150 mg.  She has not tried her propranolol yet due to fear of adverse reaction.  We discussed the known reactions and side effects of propranolol and she is willing to trial a dose before taken a steady as needed regimen.  We were able to review her echo results and unfortunately she was not able to perform the stress portion of her test due to elevated heart rate.  We reviewed triggers for stress and tachycardia and also discussed possible treatment plan moving forward after completing event monitor.  Patient denies chest pain, palpitations, dyspnea, PND, orthopnea, nausea, vomiting, dizziness, syncope, edema, weight gain, or early satiety.  Home Medications    Current Outpatient Medications  Medication Sig Dispense Refill   amphetamine-dextroamphetamine (ADDERALL XR) 20 MG 24 hr capsule Take 20 mg by mouth  daily.     levonorgestrel-ethinyl estradiol (ALTAVERA) 0.15-30 MG-MCG tablet Take one active pill daily. Skip placebo week. 84 tablet 2   naproxen (NAPROSYN) 500 MG tablet Take 1 tablet (500 mg total) by mouth 2 (two) times daily. 30 tablet 0   propranolol (INDERAL) 10 MG tablet Take 1 tablet (10 mg total) by mouth as needed (for elevated heart rate). 30 tablet 2   Semaglutide-Weight Management (WEGOVY) 0.25 MG/0.5ML SOAJ Inject 0.25 mg into the skin once a week X 4 weeks 2 mL 0   Semaglutide-Weight Management (WEGOVY) 0.25 MG/0.5ML SOAJ Inject 0.25 mg once a week x 4 weeks  Subcutaneous. 2 mL 0   SYMBICORT 80-4.5 MCG/ACT inhaler Inhale 1 puff into the lungs as needed.     Venlafaxine HCl 225 MG TB24 Take 1 tablet by mouth daily.     WEGOVY 0.25 MG/0.5ML SOAJ Inject into the skin.     No current facility-administered medications for this visit.     Review of Systems  Please see the history of present illness.    (+) Anxiety, chest heaviness (+) Stress tachycardia  All other systems reviewed and are otherwise negative except as noted above.  Physical Exam    Wt Readings from Last 3 Encounters:  02/15/22 204 lb 9.6 oz (92.8 kg)  01/04/22 190 lb (86.2 kg)  11/23/21 180 lb (81.6 kg)   VS: Vitals:   02/15/22 0812  BP: 122/82  Pulse: 97  SpO2: 95%  ,Body mass index is 32.04 kg/m.  Constitutional:      Appearance: Healthy appearance. Not in distress.  Neck:     Vascular: JVD normal.  Pulmonary:     Effort: Pulmonary effort is normal.     Breath sounds: No wheezing. No rales. Diminished in the bases Cardiovascular:     Normal rate. Regular rhythm. Normal S1. Normal S2.      Murmurs: There is no murmur.  Edema:    Peripheral edema absent.  Abdominal:     Palpations: Abdomen is soft non tender. There is no hepatomegaly.  Skin:    General: Skin is warm and dry.  Neurological:     General: No focal deficit present.     Mental Status: Alert and oriented to person, place and time.     Cranial Nerves: Cranial nerves are intact.  EKG/LABS/Other Studies Reviewed    ECG personally reviewed by me today -none completed today   Lab Results  Component Value Date   WBC 10.6 (H) 11/23/2021   HGB 12.4 11/23/2021   HCT 36.9 11/23/2021   MCV 86.4 11/23/2021   PLT 260 11/23/2021   Lab Results  Component Value Date   CREATININE 0.71 11/23/2021   BUN 10 11/23/2021   NA 137 11/23/2021   K 3.6 11/23/2021   CL 101 11/23/2021   CO2 25 11/23/2021   Lab Results  Component Value Date   ALT 16 12/31/2019   AST 18 12/31/2019   ALKPHOS 56 12/31/2019    BILITOT 0.7 12/31/2019   Lab Results  Component Value Date   CHOL 190 06/04/2019   HDL 70 06/04/2019   LDLCALC 101 (H) 06/04/2019   TRIG 93 06/04/2019   CHOLHDL 2.7 06/04/2019    No results found for: "HGBA1C"  Assessment & Plan    1.  Tachycardia: -Today patient reports that her tachycardia occurs mainly at school while teaching and she has also had some personal loss that have attributed to her stress. -She has not purchased the Chad  device and prefers to try an event monitor. -We will order 14-day ZIO monitor to evaluate for possible tacky arrhythmia associated with tachycardia -She is apprehensive about beginning propranolol due to possible side effects while teaching.  Will trial propranolol 10 mg to gauge side effects and was encouraged to continue as needed dose tachycardia. -She was given clearance to decrease her Effexor dose and will change to 150 mg -2D echo results reviewed showing hyperdynamic EF and no valve or structural heart abnormalities.  2.  Anxiety: -She reports increased work-related stress that is a trigger for her anxiety. -She will try propranolol as needed as noted above   Disposition: Follow-up with Jenkins Rouge, MD or APP in 1 months    Medication Adjustments/Labs and Tests Ordered: Current medicines are reviewed at length with the patient today.  Concerns regarding medicines are outlined above.   Signed, Mable Fill, Marissa Nestle, NP 02/15/2022, 8:54 AM [facility]  Note:  This document was prepared using Dragon voice recognition software and may include unintentional dictation errors.

## 2022-02-14 DIAGNOSIS — R000 Tachycardia, unspecified: Secondary | ICD-10-CM | POA: Diagnosis not present

## 2022-02-14 DIAGNOSIS — Z6832 Body mass index (BMI) 32.0-32.9, adult: Secondary | ICD-10-CM | POA: Diagnosis not present

## 2022-02-14 DIAGNOSIS — F418 Other specified anxiety disorders: Secondary | ICD-10-CM | POA: Diagnosis not present

## 2022-02-14 DIAGNOSIS — E669 Obesity, unspecified: Secondary | ICD-10-CM | POA: Diagnosis not present

## 2022-02-15 ENCOUNTER — Encounter: Payer: Self-pay | Admitting: Nurse Practitioner

## 2022-02-15 ENCOUNTER — Ambulatory Visit: Payer: BC Managed Care – PPO | Attending: Nurse Practitioner | Admitting: Nurse Practitioner

## 2022-02-15 ENCOUNTER — Other Ambulatory Visit: Payer: Self-pay

## 2022-02-15 ENCOUNTER — Ambulatory Visit: Payer: BC Managed Care – PPO | Attending: Nurse Practitioner

## 2022-02-15 VITALS — BP 122/82 | HR 97 | Ht 67.0 in | Wt 204.6 lb

## 2022-02-15 DIAGNOSIS — R000 Tachycardia, unspecified: Secondary | ICD-10-CM

## 2022-02-15 DIAGNOSIS — F419 Anxiety disorder, unspecified: Secondary | ICD-10-CM

## 2022-02-15 MED ORDER — LEVONORGESTREL-ETHINYL ESTRAD 0.15-30 MG-MCG PO TABS: ORAL_TABLET | ORAL | 0 refills | Status: DC

## 2022-02-15 NOTE — Progress Notes (Unsigned)
Enrolled patient for a 14 day Zio XT monitor to be mailed to patients home  Dr Johnsie Cancel to read

## 2022-02-15 NOTE — Telephone Encounter (Signed)
Medication refill request: [email or web address] Last AEX:  03-13-21 Next AEX: not scheduled Last MMG (if hormonal medication request): none Refill authorized: pt is currently being evaluated for tachycardia. No aex is scheduled. Pharmacy note placed for patient to call to schedule yearly exam. Please approve rx if appropriate.  Routing to covering provider

## 2022-02-15 NOTE — Patient Instructions (Addendum)
Medication Instructions:   Your physician recommends that you continue on your current medications as directed. Please refer to the Current Medication list given to you today.   *If you need a refill on your cardiac medications before your next appointment, please call your pharmacy*   Lab Work: [place]    If you have labs (blood work) drawn today and your tests are completely normal, you will receive your results only by: [place] (if you have MyChart) OR A paper copy in the mail If you have any lab test that is abnormal or we need to change your treatment, we will call you to review the results.   Testing/Procedures:  Your physician has recommended that you wear an event monitor. Event monitors are medical devices that record the heart's electrical activity. Doctors most often [place] these monitors to diagnose arrhythmias. Arrhythmias are problems with the speed or rhythm of the heartbeat. The monitor is a small, portable device. You can wear one while you do your normal daily activities. This is usually used to diagnose what is causing palpitations/syncope (passing out).       Follow-Up: At [facility], you and your health needs are our priority.  As part of our continuing mission to provide you with exceptional heart care, we have created designated Provider Care Teams.  These Care Teams include your primary Cardiologist (physician) and Advanced Practice Providers (APPs -  Physician Assistants and Nurse Practitioners) who all work together to provide you with the care you need, when you need it.  We recommend signing up for the patient portal called "MyChart".  Sign up information is provided on this After Visit Summary.  MyChart is used to connect with patients for Virtual Visits (Telemedicine).  Patients are able to view lab/test results, encounter notes, upcoming appointments, etc.  Non-urgent messages can be sent to your provider as well.   To learn more  about what you can do with MyChart, go to [email or web address].    Your next appointment:  1-2 Months or next available   The format for your next appointment:   In Person  Provider:   Jenkins Rouge, MD     Other Instructions  ZIO XT- Long Term Monitor Instructions  Your physician has requested you wear a ZIO patch monitor for 14 days.  This is a single patch monitor. [employer] supplies one patch monitor per enrollment. Additional stickers are not available. Please do not apply patch if you will be having a Nuclear Stress Test,  Echocardiogram, Cardiac CT, MRI, or Chest Xray during the period you would be wearing the  monitor. The patch cannot be worn during these tests. You cannot remove and re-apply the  ZIO XT patch monitor.  Your ZIO patch monitor will be mailed 3 day USPS to your address on file. It may take 3-5 days  to receive your monitor after you have been enrolled.  Once you have received your monitor, please review the enclosed instructions. Your monitor  has already been registered assigning a specific monitor serial # to you.  Billing and Patient Assistance Program Information  We have supplied [employer] with any of your insurance information on file for billing purposes. [employer] offers a sliding scale Patient Assistance Program for patients that do not have  insurance, or whose insurance does not completely cover the cost of the ZIO monitor.  You must apply for the Patient Assistance Program to qualify for this discounted rate.  To apply, please call [employer]  at [phone], select option 4, select option 2, ask to apply for  Patient Assistance Program. Theodore Demark will ask your household income, and how many people  are in your household. They will quote your out-of-pocket cost based on that information.  [employer] will also be able to set up a 89-month interest-free payment plan if needed.  Applying the monitor   Shave hair from upper left chest.  Hold abrader disc  by orange tab. Rub abrader in 40 strokes over the upper left chest as  indicated in your monitor instructions.  Clean area with 4 enclosed alcohol pads. Let dry.  Apply patch as indicated in monitor instructions. Patch will be placed under collarbone on left  side of chest with arrow pointing upward.  Rub patch adhesive wings for 2 minutes. Remove white label marked "1". Remove the white  label marked "2". Rub patch adhesive wings for 2 additional minutes.  While looking in a mirror, press and release button in center of patch. A small green light will  flash 3-4 times. This will be your only indicator that the monitor has been turned on.  Do not shower for the first 24 hours. You may shower after the first 24 hours.  Press the button if you feel a symptom. You will hear a small click. Record Date, Time and  Symptom in the Patient Logbook.  When you are ready to remove the patch, follow instructions on the last 2 pages of Patient  Logbook. Stick patch monitor onto the last page of Patient Logbook.  Place Patient Logbook in the blue and white box. Use locking tab on box and tape box closed  securely. The blue and white box has prepaid postage on it. Please place it in the mailbox as  soon as possible. Your physician should have your test results approximately 7 days after the  monitor has been [facility]  Call IS[facility]ience [place]at [phone] you have questions regarding  your ZIO XT patch monitor. Call them immediately if you see an orange light blinking on your  monitor.  If your monitor falls off in less than 4 days, contact our Monitor department at [phone]  If your monitor becomes loose or falls off after 4 days call [employer] at [phone]  suggestions on securing your monitor  Important Information About Sugar

## 2022-02-21 DIAGNOSIS — R000 Tachycardia, unspecified: Secondary | ICD-10-CM | POA: Diagnosis not present

## 2022-02-22 ENCOUNTER — Other Ambulatory Visit (HOSPITAL_BASED_OUTPATIENT_CLINIC_OR_DEPARTMENT_OTHER): Payer: Self-pay

## 2022-03-02 DIAGNOSIS — F902 Attention-deficit hyperactivity disorder, combined type: Secondary | ICD-10-CM | POA: Diagnosis not present

## 2022-03-02 DIAGNOSIS — Z79899 Other long term (current) drug therapy: Secondary | ICD-10-CM | POA: Diagnosis not present

## 2022-03-02 DIAGNOSIS — F419 Anxiety disorder, unspecified: Secondary | ICD-10-CM | POA: Diagnosis not present

## 2022-03-03 ENCOUNTER — Other Ambulatory Visit (HOSPITAL_BASED_OUTPATIENT_CLINIC_OR_DEPARTMENT_OTHER): Payer: Self-pay

## 2022-03-07 ENCOUNTER — Other Ambulatory Visit: Payer: Self-pay

## 2022-03-07 DIAGNOSIS — F330 Major depressive disorder, recurrent, mild: Secondary | ICD-10-CM | POA: Diagnosis not present

## 2022-03-08 ENCOUNTER — Other Ambulatory Visit (HOSPITAL_BASED_OUTPATIENT_CLINIC_OR_DEPARTMENT_OTHER): Payer: Self-pay

## 2022-03-16 DIAGNOSIS — J029 Acute pharyngitis, unspecified: Secondary | ICD-10-CM | POA: Diagnosis not present

## 2022-03-16 DIAGNOSIS — Z6833 Body mass index (BMI) 33.0-33.9, adult: Secondary | ICD-10-CM | POA: Diagnosis not present

## 2022-03-16 DIAGNOSIS — Z03818 Encounter for observation for suspected exposure to other biological agents ruled out: Secondary | ICD-10-CM | POA: Diagnosis not present

## 2022-03-16 DIAGNOSIS — J020 Streptococcal pharyngitis: Secondary | ICD-10-CM | POA: Diagnosis not present

## 2022-03-17 IMAGING — US US ABDOMEN COMPLETE
1 series · 14 of 25 positions shown · non-contrast
Comparison: None.

CLINICAL DATA: Weight gain.  Reflux.  Nausea.

EXAM:
ABDOMEN ULTRASOUND COMPLETE

[Series 1: us abdomen complete · 14 of 153 slices shown]
[im 1/153]
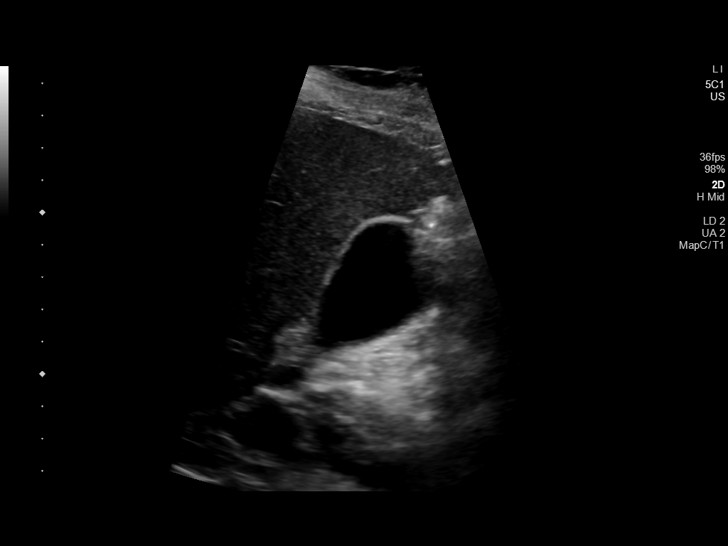
[im 13/153]
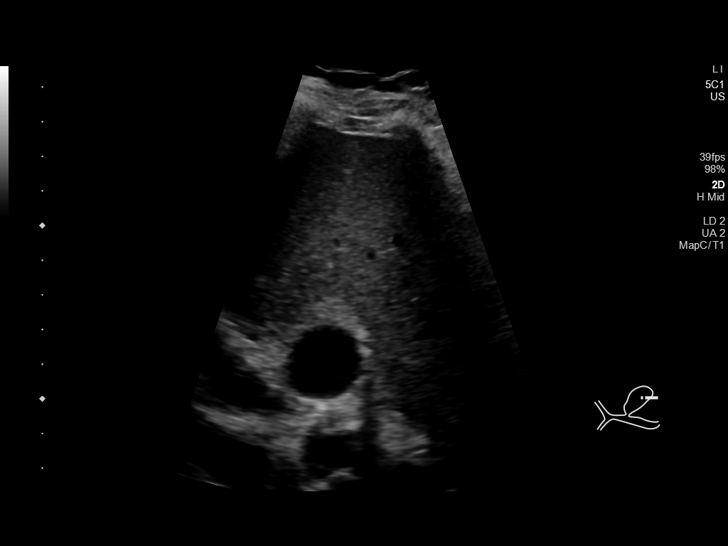
[im 26/153]
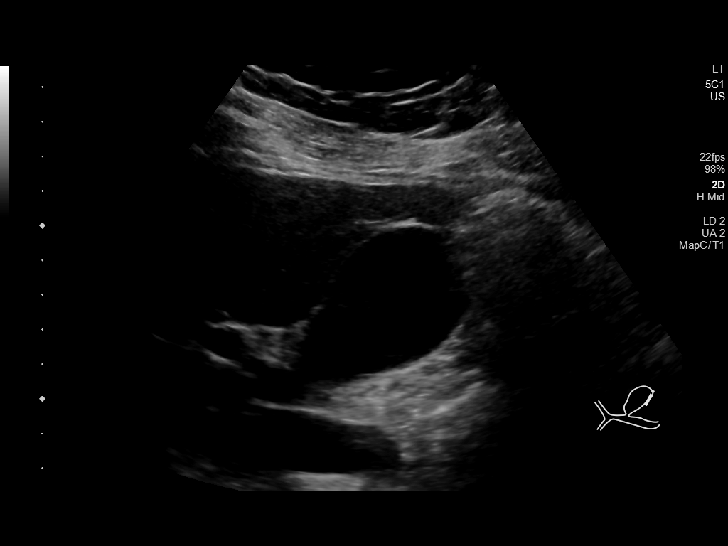
[im 39/153]
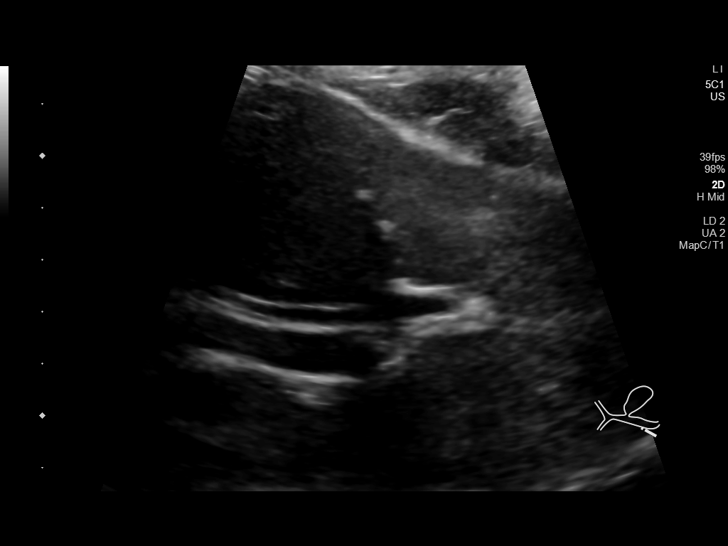
[im 51/153]
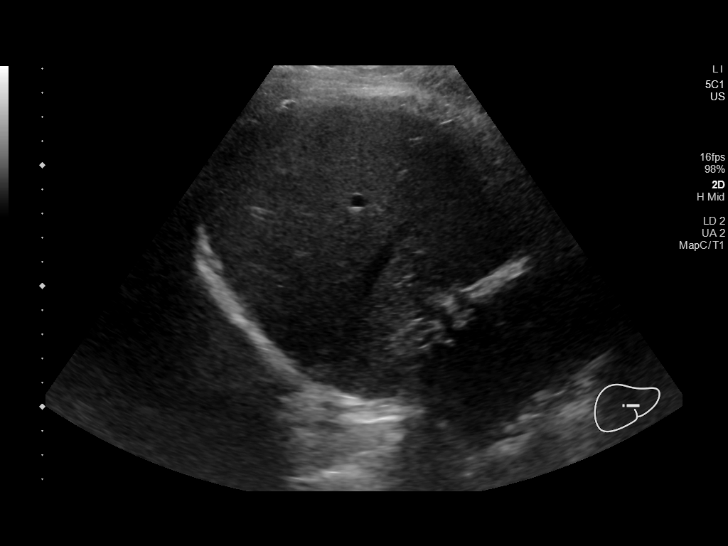
[im 58/153]
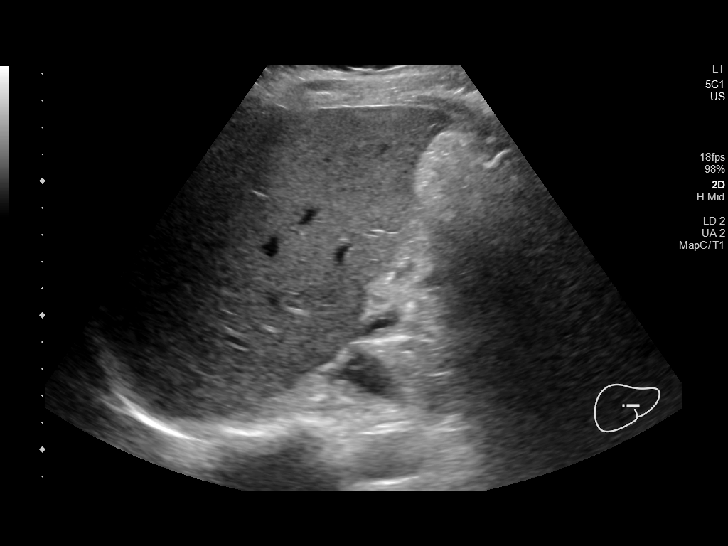
[im 70/153]
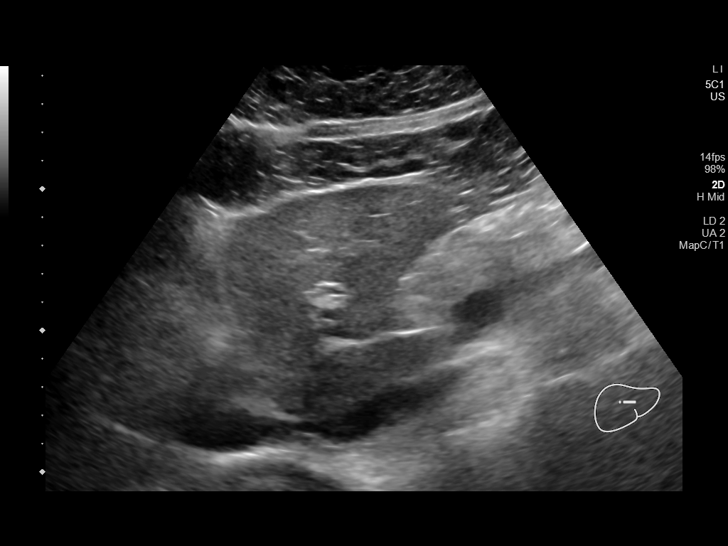
[im 83/153]
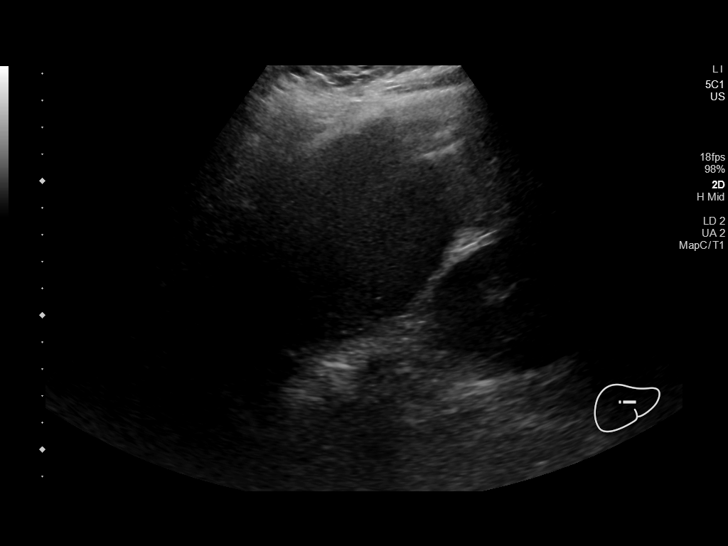
[im 96/153]
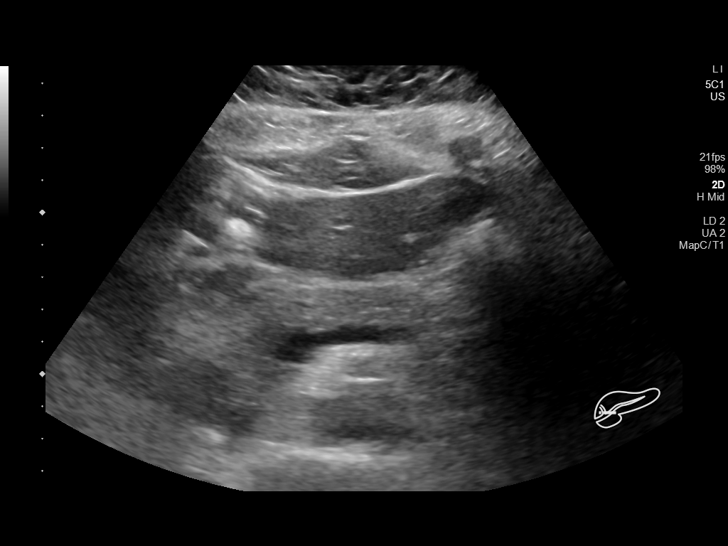
[im 102/153]
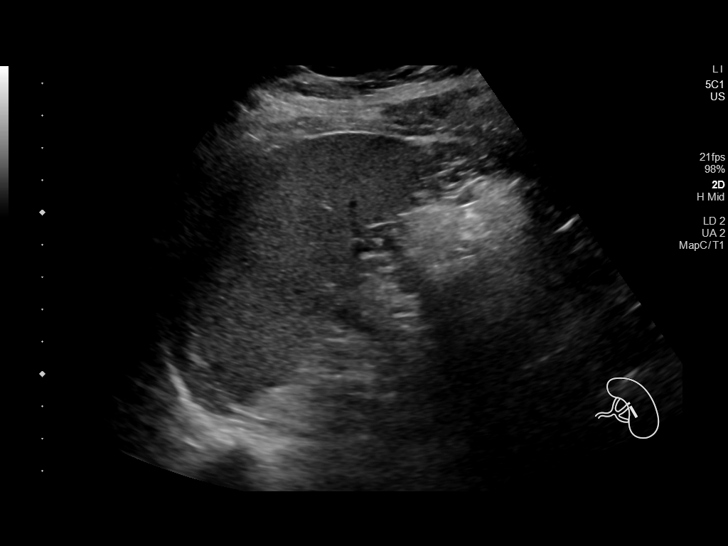
[im 115/153]
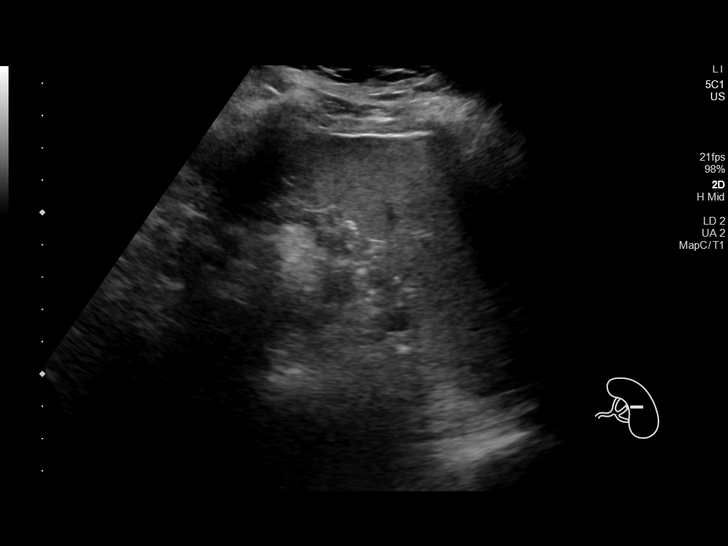
[im 127/153]
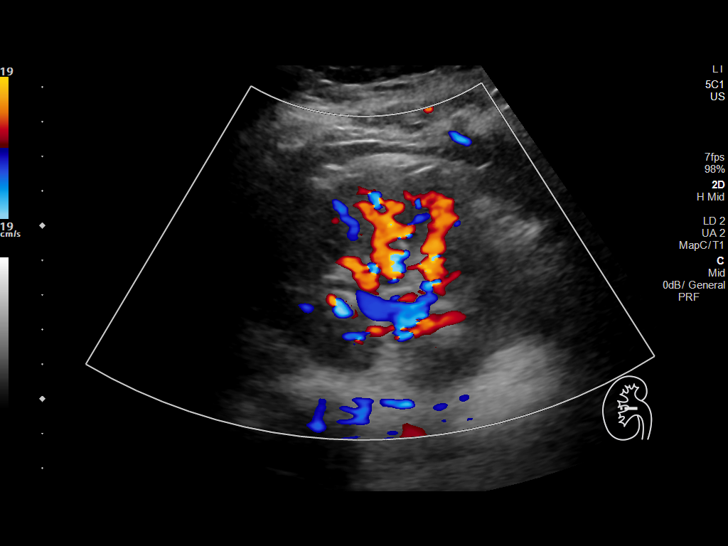
[im 140/153]
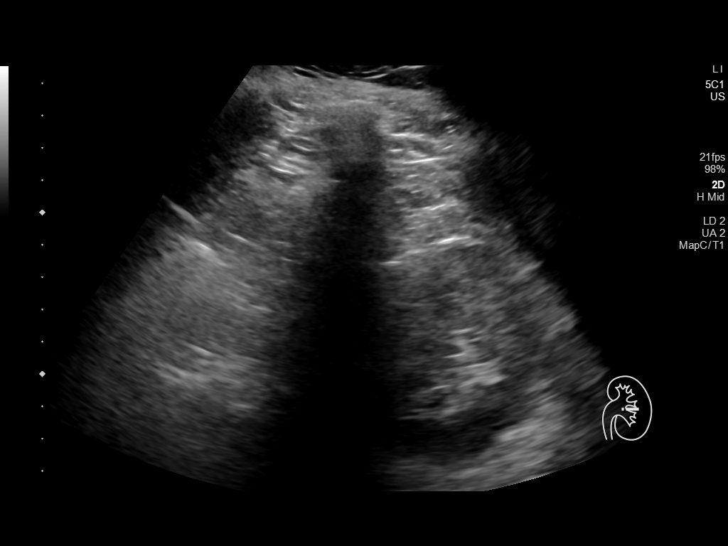
[im 153/153]
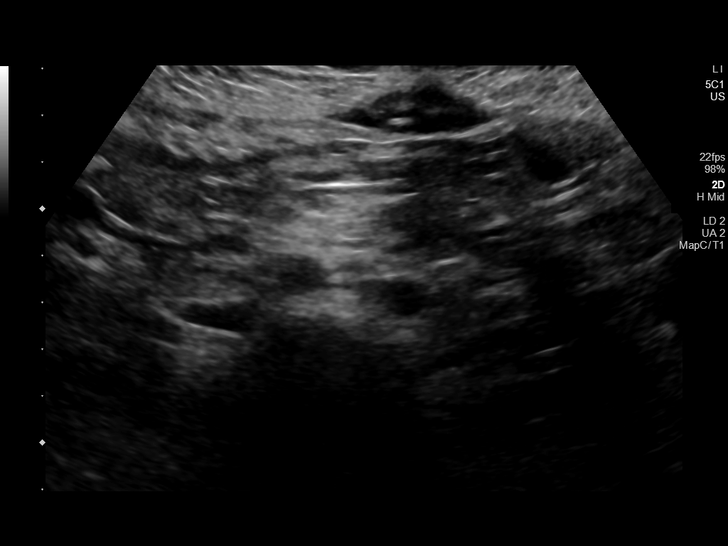

[14 of 25 positions shown; findings below may reference images not displayed]

FINDINGS: Gallbladder: No gallstones or wall thickening visualized. No
sonographic Murphy sign noted by sonographer.

Common bile duct: Diameter: 0.3 cm

Liver: No focal lesion identified. Within normal limits in
parenchymal echogenicity. Portal vein is patent on color Doppler
imaging with normal direction of blood flow towards the liver.

IVC: No abnormality visualized.

Pancreas: Pancreatic body appears normal, pancreatic head and the
distal pancreatic tail are poorly seen due to overlying bowel gas.

Spleen: Size and appearance within normal limits.

Right Kidney: Length: 11.0 cm. Echogenicity within normal limits. No
mass or hydronephrosis visualized.

Left Kidney: Length: 10.9 cm. Echogenicity within normal limits. No
mass or hydronephrosis visualized.

Abdominal aorta: No aneurysm visualized.

Other findings: None.
IMPRESSION: 1. No specific abnormality is identified to account for the
patient's symptoms.
2. The pancreatic tail and head are obscured by overlying bowel gas.

## 2022-03-21 DIAGNOSIS — F330 Major depressive disorder, recurrent, mild: Secondary | ICD-10-CM | POA: Diagnosis not present

## 2022-04-04 DIAGNOSIS — F330 Major depressive disorder, recurrent, mild: Secondary | ICD-10-CM | POA: Diagnosis not present

## 2022-04-18 DIAGNOSIS — F330 Major depressive disorder, recurrent, mild: Secondary | ICD-10-CM | POA: Diagnosis not present

## 2022-04-26 ENCOUNTER — Other Ambulatory Visit: Payer: Self-pay | Admitting: Radiology

## 2022-05-02 DIAGNOSIS — F330 Major depressive disorder, recurrent, mild: Secondary | ICD-10-CM | POA: Diagnosis not present

## 2022-05-08 NOTE — Progress Notes (Deleted)
CARDIOLOGY CONSULT NOTE       Patient ID: Angela Hill MRN: [id number] DOB/AGE: 03-03-1985 37 y.o.  Admit date: (Not on file) Referring Physician: Orland Mustard Primary Physician: London Pepper, MD Primary Cardiologist: Johnsie Cancel   HPI:  37 y.o. referred by Dr Orland Mustard for tachycardia. First seen 01/04/22 History of Anxiety, ADHD, Asthma She is on Adderall , Effexor and Ativan Since October has had more issues with tachycardia.  Sudden onset at work and home Initial episodes in October lasted all day Seen at [facility] 11/23/21 with diffuse tightness in chest and dyspnea ? 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 [place]  She is from [place] and [place] she was 71 yo She has 3 siblings in [place] to walk on treadmill for 30 minutes but getting more winded Denies drugs or other stimulants besides meds    TTE 01/24/22 EF normal no bad valves  Monitor 03/26/22 Average HR 104 bpm some self limited SVT longest only 10 beats PAC/PVC;s < 1% total beats   She was given inderal 10 mg to take but was hesitant to start ***  ROS All other systems reviewed and negative except as noted above  Past Medical History:  Diagnosis Date   ADHD    Allergy    Anxiety    Asthma    Seasonal   Chest pain    Chronic recurrent sinusitis 9/15 - present   Depression    Obesity    Tachycardia    Yeast vaginitis    Recurring    Family History  Problem Relation Age of Onset   Hypertension Mother    Cirrhosis Mother    Depression Mother    Anxiety disorder Mother    Dementia Father    AAA (abdominal aortic aneurysm) Father    Healthy Sister    GI Disease Brother        GERD   Healthy Brother    Other Maternal  Grandmother        unknown   Other Maternal Grandfather        unknown   Other Paternal Grandmother        unknown   Other Paternal Grandfather        unknown   Colon cancer Neg Hx    Stomach cancer Neg Hx    Esophageal cancer Neg Hx    Pancreatic cancer Neg Hx    Rectal cancer Neg Hx     Social History   Socioeconomic History   Marital status: Single    Spouse name: Not on file   Number of children: Not on file   Years of education: Not on file   Highest education level: Not on file  Occupational History   Not on file  Tobacco Use   Smoking status: Never   Smokeless tobacco: Never  Vaping Use   Vaping Use: Never used  Substance and Sexual Activity   Alcohol use: Yes    Comment: Social   Drug use: No   Sexual activity: Not Currently    Birth control/protection: Pill    Comment: 1 st intercourse17 yo-Fewer than 5 partners  Other Topics Concern   Not on file  Social History Narrative  Not on file   Social Determinants of Health   Financial Resource Strain: Not on file  Food Insecurity: Not on file  Transportation Needs: Not on file  Physical Activity: Not on file  Stress: Not on file  Social Connections: Not on file  Intimate Partner Violence: Not on file    Past Surgical History:  Procedure Laterality Date   BREAST BIOPSY     Insert Mirena  4/12   removed 2015   UPPER GASTROINTESTINAL ENDOSCOPY     2006. States she had increased emesis after eating. Unremarkable      Current Outpatient Medications:    amphetamine-dextroamphetamine (ADDERALL XR) 20 MG 24 hr capsule, Take 20 mg by mouth daily., Disp: , Rfl:    levonorgestrel-ethinyl estradiol (ALTAVERA) 0.15-30 MG-MCG tablet, Take one active pill daily. Skip placebo week., Disp: 84 tablet, Rfl: 0   naproxen (NAPROSYN) 500 MG tablet, Take 1 tablet (500 mg total) by mouth 2 (two) times daily., Disp: 30 tablet, Rfl: 0   propranolol (INDERAL) 10 MG tablet, Take 1 tablet (10 mg total) by mouth as needed (for  elevated heart rate)., Disp: 30 tablet, Rfl: 2   Semaglutide-Weight Management (WEGOVY) 0.25 MG/0.5ML SOAJ, Inject 0.25 mg into the skin once a week X 4 weeks, Disp: 2 mL, Rfl: 0   Semaglutide-Weight Management (WEGOVY) 0.25 MG/0.5ML SOAJ, Inject 0.25 mg once a week x 4 weeks Subcutaneous., Disp: 2 mL, Rfl: 0   SYMBICORT 80-4.5 MCG/ACT inhaler, Inhale 1 puff into the lungs as needed., Disp: , Rfl:    Venlafaxine HCl 225 MG TB24, Take 1 tablet by mouth daily., Disp: , Rfl:    WEGOVY 0.25 MG/0.5ML SOAJ, Inject into the skin., Disp: , Rfl:     Physical Exam: There were no vitals taken for this visit.    Affect appropriate Healthy:  appears stated age 37: normal Neck supple with no adenopathy JVP normal no bruits no thyromegaly Lungs clear with no wheezing and good diaphragmatic motion Heart:  S1/S2 no murmur, no rub, gallop or click PMI normal Abdomen: benighn, BS positve, no tenderness, no AAA no bruit.  No HSM or HJR Distal pulses intact with no bruits No edema Neuro non-focal Skin warm and dry No muscular weakness   Labs:   Lab Results  Component Value Date   WBC 10.6 (H) 11/23/2021   HGB 12.4 11/23/2021   HCT 36.9 11/23/2021   MCV 86.4 11/23/2021   PLT 260 11/23/2021   No results for input(s): "NA", "K", "CL", "CO2", "BUN", "CREATININE", "CALCIUM", "PROT", "BILITOT", "ALKPHOS", "ALT", "AST", "GLUCOSE" in the last 168 hours.  Invalid input(s): "LABALBU" No results found for: "CKTOTAL", "CKMB", "CKMBINDEX", "TROPONINI"  Lab Results  Component Value Date   CHOL 190 06/04/2019   CHOL 178 09/22/2015   CHOL 174 06/21/2014   Lab Results  Component Value Date   HDL 70 06/04/2019   HDL 71 09/22/2015   Lab Results  Component Value Date   LDLCALC 101 (H) 06/04/2019   LDLCALC 93 09/22/2015   Lab Results  Component Value Date   TRIG 93 06/04/2019   TRIG 72 09/22/2015   Lab Results  Component Value Date   CHOLHDL 2.7 06/04/2019   CHOLHDL 2.5 09/22/2015   No  results found for: "LDLDIRECT"    Radiology: No results found.  EKG: SR rate 94 short PR 118 msec no pre excitation non specific ST changes    ASSESSMENT AND PLAN:   Tachcyardia:  in setting of ADHD, Anxiety on Adderall,  Effexor and Ativan ECG normal High adrenergic tone as evidenced by short PR no Pre excitation Hct 36.9  TSH normal 2021  Echo benign monitor benign with high average HR Again discussed utility of beta blocker ***  Update TSH/T4 with T3  F/U in 6 months   Signed: Jenkins Rouge 05/08/2022, 5:15 PM

## 2022-05-15 ENCOUNTER — Ambulatory Visit: Payer: BC Managed Care – PPO | Admitting: Cardiovascular Disease

## 2022-05-17 DIAGNOSIS — H66001 Acute suppurative otitis media without spontaneous rupture of ear drum, right ear: Secondary | ICD-10-CM | POA: Diagnosis not present

## 2022-05-17 DIAGNOSIS — Z6833 Body mass index (BMI) 33.0-33.9, adult: Secondary | ICD-10-CM | POA: Diagnosis not present

## 2022-05-17 DIAGNOSIS — R030 Elevated blood-pressure reading, without diagnosis of hypertension: Secondary | ICD-10-CM | POA: Diagnosis not present

## 2022-05-28 DIAGNOSIS — Z79891 Long term (current) use of opiate analgesic: Secondary | ICD-10-CM | POA: Diagnosis not present

## 2022-05-28 DIAGNOSIS — F902 Attention-deficit hyperactivity disorder, combined type: Secondary | ICD-10-CM | POA: Diagnosis not present

## 2022-05-28 DIAGNOSIS — Z5181 Encounter for therapeutic drug level monitoring: Secondary | ICD-10-CM | POA: Diagnosis not present

## 2022-05-28 DIAGNOSIS — F419 Anxiety disorder, unspecified: Secondary | ICD-10-CM | POA: Diagnosis not present

## 2022-05-28 DIAGNOSIS — Z79899 Other long term (current) drug therapy: Secondary | ICD-10-CM | POA: Diagnosis not present

## 2022-05-30 DIAGNOSIS — F330 Major depressive disorder, recurrent, mild: Secondary | ICD-10-CM | POA: Diagnosis not present

## 2022-06-13 DIAGNOSIS — F330 Major depressive disorder, recurrent, mild: Secondary | ICD-10-CM | POA: Diagnosis not present

## 2022-06-15 DIAGNOSIS — F411 Generalized anxiety disorder: Secondary | ICD-10-CM | POA: Diagnosis not present

## 2022-06-15 DIAGNOSIS — Z6834 Body mass index (BMI) 34.0-34.9, adult: Secondary | ICD-10-CM | POA: Diagnosis not present

## 2022-06-15 DIAGNOSIS — F909 Attention-deficit hyperactivity disorder, unspecified type: Secondary | ICD-10-CM | POA: Diagnosis not present

## 2022-06-20 ENCOUNTER — Other Ambulatory Visit: Payer: Self-pay

## 2022-06-20 DIAGNOSIS — Z3041 Encounter for surveillance of contraceptive pills: Secondary | ICD-10-CM

## 2022-06-20 NOTE — Telephone Encounter (Signed)
Pt LVM in triage line requesting refill of BCPs. States wasn't able to make an appt until 08/06/2022 and is requesting 2 months worth.  Last AEX 03/13/2021--scheduled for 08/06/2022.  Please advise. Rx pend.

## 2022-06-21 MED ORDER — LEVONORGESTREL-ETHINYL ESTRAD 0.15-30 MG-MCG PO TABS: ORAL_TABLET | ORAL | 0 refills | Status: DC

## 2022-06-27 DIAGNOSIS — F330 Major depressive disorder, recurrent, mild: Secondary | ICD-10-CM | POA: Diagnosis not present

## 2022-06-28 DIAGNOSIS — Z6833 Body mass index (BMI) 33.0-33.9, adult: Secondary | ICD-10-CM | POA: Diagnosis not present

## 2022-06-28 DIAGNOSIS — J0140 Acute pansinusitis, unspecified: Secondary | ICD-10-CM | POA: Diagnosis not present

## 2022-07-03 ENCOUNTER — Other Ambulatory Visit: Payer: Self-pay

## 2022-07-03 MED ORDER — WEGOVY 0.25 MG/0.5ML ~~LOC~~ SOAJ
0.2500 mg | SUBCUTANEOUS | 1 refills | Status: DC
  Filled 2022-07-03 (×2): qty 2, 28d supply, fill #0

## 2022-07-04 ENCOUNTER — Other Ambulatory Visit: Payer: Self-pay

## 2022-07-04 MED ORDER — WEGOVY 0.5 MG/0.5ML ~~LOC~~ SOAJ
0.5000 mg | SUBCUTANEOUS | 1 refills | Status: AC
  Filled 2022-07-04: qty 2, 28d supply, fill #0
  Filled 2022-07-28 – 2022-08-07 (×2): qty 2, 28d supply, fill #1

## 2022-07-05 ENCOUNTER — Other Ambulatory Visit: Payer: Self-pay

## 2022-07-11 DIAGNOSIS — F330 Major depressive disorder, recurrent, mild: Secondary | ICD-10-CM | POA: Diagnosis not present

## 2022-07-12 ENCOUNTER — Other Ambulatory Visit: Payer: Self-pay

## 2022-08-06 ENCOUNTER — Other Ambulatory Visit: Payer: Self-pay

## 2022-08-06 ENCOUNTER — Ambulatory Visit: Payer: BC Managed Care – PPO | Admitting: Nurse Practitioner

## 2022-08-06 NOTE — Progress Notes (Deleted)
   Angela Hill 26-Aug-1985 [id number]   History:  37 y.o. G0 presents for annual exam. OCPs continuously with occasional withdrawal bleed. Normal pap history. History of IBS.   Gynecologic History No LMP recorded.   Contraception/Family planning: OCP (estrogen/progesterone) Sexually active: No  Health Maintenance Last Pap: 06/04/2019. Results were: Normal, 3-year repeat Last mammogram: Not indicated Last colonoscopy: 04/19/2020. Results were: Normal Last Dexa: Not indicated  Past medical history, past surgical history, family history and social history were all reviewed and documented in the EPIC chart. 2nd grade teacher.   ROS:  A ROS was performed and pertinent positives and negatives are included.  Exam:  There were no vitals filed for this visit.  There is no height or weight on file to calculate BMI.  General appearance:  Normal Thyroid:  Symmetrical, normal in size, without palpable masses or nodularity. Respiratory  Auscultation:  Clear without wheezing or rhonchi Cardiovascular  Auscultation:  Regular rate, without rubs, murmurs or gallops  Edema/varicosities:  Not grossly evident Abdominal  Soft,nontender, without masses, guarding or rebound.  Liver/spleen:  No organomegaly noted  Hernia:  None appreciated  Skin  Inspection:  Grossly normal Breasts: Examined lying and sitting.   Right: Without masses, retractions, nipple discharge or axillary adenopathy.   Left: Without masses, retractions, nipple discharge or axillary adenopathy. Genitourinary   Inguinal/mons:  Normal without inguinal adenopathy  External genitalia:  Normal appearing vulva with no masses, tenderness, or lesions  BUS/Urethra/Skene's glands:  Normal  Vagina:  Normal appearing with normal color and discharge, no lesions  Cervix:  Normal appearing without discharge or lesions  Uterus:  Normal in size, shape and contour.  Midline and mobile, nontender  Adnexa/parametria:     Rt: Normal in  size, without masses or tenderness.   Lt: Normal in size, without masses or tenderness.  Anus and perineum: Normal  Patient informed chaperone available to be present for breast and pelvic exam. Patient has requested no chaperone to be present. Patient has been advised what will be completed during breast and pelvic exam.   Assessment/Plan:  37 y.o. G0 for annual exam.   Well female exam with routine gynecological exam - Education provided on SBEs, importance of preventative screenings, current guidelines, high calcium diet, regular exercise, and multivitamin daily. Labs with PCP.   Encounter for surveillance of contraceptive pills - OCPs continuously with occasional withdrawal bleed. Taking as prescribed.   Return in 1 year for annual.     Olivia Mackie DNP, 8:23 AM 08/06/2022

## 2022-08-07 ENCOUNTER — Other Ambulatory Visit: Payer: Self-pay

## 2022-08-08 DIAGNOSIS — F330 Major depressive disorder, recurrent, mild: Secondary | ICD-10-CM | POA: Diagnosis not present

## 2022-08-22 DIAGNOSIS — F330 Major depressive disorder, recurrent, mild: Secondary | ICD-10-CM | POA: Diagnosis not present

## 2022-09-05 DIAGNOSIS — F330 Major depressive disorder, recurrent, mild: Secondary | ICD-10-CM | POA: Diagnosis not present

## 2022-09-07 ENCOUNTER — Other Ambulatory Visit: Payer: Self-pay

## 2022-09-07 DIAGNOSIS — Z79899 Other long term (current) drug therapy: Secondary | ICD-10-CM | POA: Diagnosis not present

## 2022-09-07 DIAGNOSIS — F419 Anxiety disorder, unspecified: Secondary | ICD-10-CM | POA: Diagnosis not present

## 2022-09-07 DIAGNOSIS — F902 Attention-deficit hyperactivity disorder, combined type: Secondary | ICD-10-CM | POA: Diagnosis not present

## 2022-09-07 MED ORDER — WEGOVY 1 MG/0.5ML ~~LOC~~ SOAJ
1.0000 mg | SUBCUTANEOUS | 0 refills | Status: AC
  Filled 2022-09-07: qty 2, 28d supply, fill #0

## 2022-09-21 DIAGNOSIS — R809 Proteinuria, unspecified: Secondary | ICD-10-CM | POA: Diagnosis not present

## 2022-09-21 DIAGNOSIS — N3001 Acute cystitis with hematuria: Secondary | ICD-10-CM | POA: Diagnosis not present

## 2022-09-21 DIAGNOSIS — R300 Dysuria: Secondary | ICD-10-CM | POA: Diagnosis not present

## 2022-09-21 DIAGNOSIS — R824 Acetonuria: Secondary | ICD-10-CM | POA: Diagnosis not present

## 2022-09-21 DIAGNOSIS — N3000 Acute cystitis without hematuria: Secondary | ICD-10-CM | POA: Diagnosis not present

## 2022-10-03 ENCOUNTER — Other Ambulatory Visit: Payer: Self-pay

## 2022-10-03 DIAGNOSIS — Z6833 Body mass index (BMI) 33.0-33.9, adult: Secondary | ICD-10-CM | POA: Diagnosis not present

## 2022-10-03 DIAGNOSIS — N390 Urinary tract infection, site not specified: Secondary | ICD-10-CM | POA: Diagnosis not present

## 2022-10-03 DIAGNOSIS — F418 Other specified anxiety disorders: Secondary | ICD-10-CM | POA: Diagnosis not present

## 2022-10-03 DIAGNOSIS — Z309 Encounter for contraceptive management, unspecified: Secondary | ICD-10-CM | POA: Diagnosis not present

## 2022-10-03 MED ORDER — WEGOVY 1.7 MG/0.75ML ~~LOC~~ SOAJ
1.7000 mg | SUBCUTANEOUS | 0 refills | Status: AC
  Filled 2022-10-03: qty 3, 28d supply, fill #0

## 2022-10-04 ENCOUNTER — Other Ambulatory Visit: Payer: Self-pay

## 2022-10-09 ENCOUNTER — Other Ambulatory Visit: Payer: Self-pay

## 2022-10-17 DIAGNOSIS — F330 Major depressive disorder, recurrent, mild: Secondary | ICD-10-CM | POA: Diagnosis not present

## 2022-10-18 ENCOUNTER — Other Ambulatory Visit: Payer: Self-pay

## 2022-10-19 ENCOUNTER — Other Ambulatory Visit: Payer: Self-pay

## 2022-10-25 ENCOUNTER — Other Ambulatory Visit: Payer: Self-pay

## 2022-10-29 DIAGNOSIS — Z01419 Encounter for gynecological examination (general) (routine) without abnormal findings: Secondary | ICD-10-CM | POA: Diagnosis not present

## 2022-10-31 ENCOUNTER — Other Ambulatory Visit: Payer: Self-pay

## 2022-10-31 DIAGNOSIS — F330 Major depressive disorder, recurrent, mild: Secondary | ICD-10-CM | POA: Diagnosis not present

## 2022-11-28 DIAGNOSIS — F330 Major depressive disorder, recurrent, mild: Secondary | ICD-10-CM | POA: Diagnosis not present

## 2022-12-10 DIAGNOSIS — F419 Anxiety disorder, unspecified: Secondary | ICD-10-CM | POA: Diagnosis not present

## 2022-12-10 DIAGNOSIS — F902 Attention-deficit hyperactivity disorder, combined type: Secondary | ICD-10-CM | POA: Diagnosis not present

## 2022-12-10 DIAGNOSIS — Z79899 Other long term (current) drug therapy: Secondary | ICD-10-CM | POA: Diagnosis not present

## 2022-12-11 DIAGNOSIS — F902 Attention-deficit hyperactivity disorder, combined type: Secondary | ICD-10-CM | POA: Diagnosis not present

## 2022-12-12 DIAGNOSIS — F330 Major depressive disorder, recurrent, mild: Secondary | ICD-10-CM | POA: Diagnosis not present

## 2022-12-26 DIAGNOSIS — F330 Major depressive disorder, recurrent, mild: Secondary | ICD-10-CM | POA: Diagnosis not present

## 2023-01-07 DIAGNOSIS — Z6833 Body mass index (BMI) 33.0-33.9, adult: Secondary | ICD-10-CM | POA: Diagnosis not present

## 2023-02-01 ENCOUNTER — Other Ambulatory Visit: Payer: Self-pay | Admitting: Radiology

## 2023-02-01 DIAGNOSIS — Z3041 Encounter for surveillance of contraceptive pills: Secondary | ICD-10-CM

## 2023-02-01 NOTE — Telephone Encounter (Signed)
Med refill request: [facility] Last AEX: 03/13/21 TW Next AEX: none scheduled Last MMG (if hormonal med) n/a Refill denied.  Sent to provider for review.

## 2023-03-13 DIAGNOSIS — Z79899 Other long term (current) drug therapy: Secondary | ICD-10-CM | POA: Diagnosis not present

## 2023-03-13 DIAGNOSIS — F902 Attention-deficit hyperactivity disorder, combined type: Secondary | ICD-10-CM | POA: Diagnosis not present

## 2023-03-20 DIAGNOSIS — F330 Major depressive disorder, recurrent, mild: Secondary | ICD-10-CM | POA: Diagnosis not present

## 2023-03-21 ENCOUNTER — Other Ambulatory Visit: Payer: Self-pay | Admitting: Nurse Practitioner

## 2023-03-21 DIAGNOSIS — Z3041 Encounter for surveillance of contraceptive pills: Secondary | ICD-10-CM

## 2023-03-21 NOTE — Telephone Encounter (Signed)
Med refill request: [facility] Last AEX: 03/13/21 TW Next AEX: none scheduled, no showed 08/06/22 appt Last MMG (if hormonal med) n/a Refill denied.  Sent to provider for review.

## 2023-04-03 DIAGNOSIS — L02222 Furuncle of back [any part, except buttock]: Secondary | ICD-10-CM | POA: Diagnosis not present

## 2023-04-03 DIAGNOSIS — L732 Hidradenitis suppurativa: Secondary | ICD-10-CM | POA: Diagnosis not present

## 2023-04-03 DIAGNOSIS — L02221 Furuncle of abdominal wall: Secondary | ICD-10-CM | POA: Diagnosis not present

## 2023-04-03 DIAGNOSIS — L0232 Furuncle of buttock: Secondary | ICD-10-CM | POA: Diagnosis not present

## 2023-04-05 DIAGNOSIS — F418 Other specified anxiety disorders: Secondary | ICD-10-CM | POA: Diagnosis not present

## 2023-04-05 DIAGNOSIS — Z6835 Body mass index (BMI) 35.0-35.9, adult: Secondary | ICD-10-CM | POA: Diagnosis not present

## 2023-04-05 DIAGNOSIS — R062 Wheezing: Secondary | ICD-10-CM | POA: Diagnosis not present

## 2023-04-05 DIAGNOSIS — J988 Other specified respiratory disorders: Secondary | ICD-10-CM | POA: Diagnosis not present

## 2023-06-14 DIAGNOSIS — F902 Attention-deficit hyperactivity disorder, combined type: Secondary | ICD-10-CM | POA: Diagnosis not present

## 2023-06-14 DIAGNOSIS — Z79899 Other long term (current) drug therapy: Secondary | ICD-10-CM | POA: Diagnosis not present

## 2023-07-23 DIAGNOSIS — J019 Acute sinusitis, unspecified: Secondary | ICD-10-CM | POA: Diagnosis not present

## 2023-09-06 DIAGNOSIS — M25522 Pain in left elbow: Secondary | ICD-10-CM | POA: Diagnosis not present

## 2023-09-06 DIAGNOSIS — S62667A Nondisplaced fracture of distal phalanx of left little finger, initial encounter for closed fracture: Secondary | ICD-10-CM | POA: Diagnosis not present

## 2023-09-24 DIAGNOSIS — F902 Attention-deficit hyperactivity disorder, combined type: Secondary | ICD-10-CM | POA: Diagnosis not present

## 2023-10-15 DIAGNOSIS — F411 Generalized anxiety disorder: Secondary | ICD-10-CM | POA: Diagnosis not present

## 2023-10-15 DIAGNOSIS — F909 Attention-deficit hyperactivity disorder, unspecified type: Secondary | ICD-10-CM | POA: Diagnosis not present

## 2024-01-20 DIAGNOSIS — Z23 Encounter for immunization: Secondary | ICD-10-CM | POA: Diagnosis not present

## 2024-01-20 DIAGNOSIS — F909 Attention-deficit hyperactivity disorder, unspecified type: Secondary | ICD-10-CM | POA: Diagnosis not present

## 2024-01-20 DIAGNOSIS — F411 Generalized anxiety disorder: Secondary | ICD-10-CM | POA: Diagnosis not present

## 2024-01-20 DIAGNOSIS — R002 Palpitations: Secondary | ICD-10-CM | POA: Diagnosis not present
